# Patient Record
Sex: Male | Born: 1935 | Race: White | Hispanic: No | Smoking: Former smoker
Health system: Southern US, Community
[De-identification: ages and names within clinical notes are randomized; demographics above are authoritative.]

## PROBLEM LIST (undated history)

## (undated) DIAGNOSIS — I10 Essential (primary) hypertension: Secondary | ICD-10-CM

## (undated) DIAGNOSIS — E785 Hyperlipidemia, unspecified: Secondary | ICD-10-CM

## (undated) DIAGNOSIS — N289 Disorder of kidney and ureter, unspecified: Secondary | ICD-10-CM

## (undated) DIAGNOSIS — I2510 Atherosclerotic heart disease of native coronary artery without angina pectoris: Secondary | ICD-10-CM

## (undated) DIAGNOSIS — Z8489 Family history of other specified conditions: Secondary | ICD-10-CM

## (undated) DIAGNOSIS — I951 Orthostatic hypotension: Secondary | ICD-10-CM

## (undated) DIAGNOSIS — M7989 Other specified soft tissue disorders: Secondary | ICD-10-CM

## (undated) DIAGNOSIS — K219 Gastro-esophageal reflux disease without esophagitis: Secondary | ICD-10-CM

## (undated) DIAGNOSIS — M1990 Unspecified osteoarthritis, unspecified site: Secondary | ICD-10-CM

## (undated) HISTORY — DX: Unspecified osteoarthritis, unspecified site: M19.90

## (undated) HISTORY — DX: Atherosclerotic heart disease of native coronary artery without angina pectoris: I25.10

## (undated) HISTORY — DX: Essential (primary) hypertension: I10

## (undated) HISTORY — DX: Disorder of kidney and ureter, unspecified: N28.9

## (undated) HISTORY — DX: Hyperlipidemia, unspecified: E78.5

## (undated) HISTORY — DX: Orthostatic hypotension: I95.1

## (undated) HISTORY — DX: Other specified soft tissue disorders: M79.89

---

## 1996-08-01 HISTORY — PX: CORONARY ANGIOPLASTY: SHX604

## 1997-06-14 HISTORY — PX: CARDIAC CATHETERIZATION: SHX172

## 1998-02-19 ENCOUNTER — Other Ambulatory Visit: Admission: RE | Admit: 1998-02-19 | Discharge: 1998-02-19 | Payer: Self-pay | Admitting: Family Medicine

## 1998-09-25 ENCOUNTER — Ambulatory Visit (HOSPITAL_COMMUNITY): Admission: RE | Admit: 1998-09-25 | Discharge: 1998-09-25 | Payer: Self-pay | Admitting: Family Medicine

## 1999-01-05 ENCOUNTER — Ambulatory Visit (HOSPITAL_COMMUNITY): Admission: RE | Admit: 1999-01-05 | Discharge: 1999-01-05 | Payer: Self-pay | Admitting: Family Medicine

## 1999-06-11 ENCOUNTER — Ambulatory Visit (HOSPITAL_COMMUNITY): Admission: RE | Admit: 1999-06-11 | Discharge: 1999-06-12 | Payer: Self-pay | Admitting: Cardiology

## 1999-06-11 HISTORY — PX: CARDIAC CATHETERIZATION: SHX172

## 2000-10-27 ENCOUNTER — Encounter: Admission: RE | Admit: 2000-10-27 | Discharge: 2000-10-27 | Payer: Self-pay | Admitting: Family Medicine

## 2000-10-27 ENCOUNTER — Encounter: Payer: Self-pay | Admitting: Family Medicine

## 2003-04-09 ENCOUNTER — Encounter: Payer: Self-pay | Admitting: Cardiology

## 2003-04-09 ENCOUNTER — Encounter: Admission: RE | Admit: 2003-04-09 | Discharge: 2003-04-09 | Payer: Self-pay | Admitting: Cardiology

## 2003-04-16 ENCOUNTER — Ambulatory Visit (HOSPITAL_COMMUNITY): Admission: RE | Admit: 2003-04-16 | Discharge: 2003-04-16 | Payer: Self-pay | Admitting: Cardiology

## 2003-04-16 HISTORY — PX: CARDIAC CATHETERIZATION: SHX172

## 2004-02-25 ENCOUNTER — Encounter: Admission: RE | Admit: 2004-02-25 | Discharge: 2004-02-25 | Payer: Self-pay | Admitting: Family Medicine

## 2005-08-20 ENCOUNTER — Encounter: Admission: RE | Admit: 2005-08-20 | Discharge: 2005-08-20 | Payer: Self-pay | Admitting: Family Medicine

## 2005-09-01 ENCOUNTER — Encounter: Admission: RE | Admit: 2005-09-01 | Discharge: 2005-09-01 | Payer: Self-pay | Admitting: Family Medicine

## 2006-09-16 ENCOUNTER — Encounter: Admission: RE | Admit: 2006-09-16 | Discharge: 2006-09-16 | Payer: Self-pay | Admitting: Family Medicine

## 2006-10-04 ENCOUNTER — Encounter: Admission: RE | Admit: 2006-10-04 | Discharge: 2006-11-02 | Payer: Self-pay | Admitting: Family Medicine

## 2007-11-16 HISTORY — PX: BACK SURGERY: SHX140

## 2009-07-11 ENCOUNTER — Inpatient Hospital Stay (HOSPITAL_COMMUNITY): Admission: EM | Admit: 2009-07-11 | Discharge: 2009-07-17 | Payer: Self-pay | Admitting: Emergency Medicine

## 2009-07-15 ENCOUNTER — Ambulatory Visit: Payer: Self-pay | Admitting: Oncology

## 2009-07-17 ENCOUNTER — Ambulatory Visit: Payer: Self-pay | Admitting: Oncology

## 2009-07-28 LAB — COMPREHENSIVE METABOLIC PANEL
ALT: 36 U/L (ref 0–53)
AST: 37 U/L (ref 0–37)
Albumin: 3.6 g/dL (ref 3.5–5.2)
CO2: 26 mEq/L (ref 19–32)
Calcium: 9.2 mg/dL (ref 8.4–10.5)
Chloride: 105 mEq/L (ref 96–112)
Potassium: 3.4 mEq/L — ABNORMAL LOW (ref 3.5–5.3)
Total Protein: 6.6 g/dL (ref 6.0–8.3)

## 2009-07-28 LAB — CBC & DIFF AND RETIC
Basophils Absolute: 0.1 10*3/uL (ref 0.0–0.1)
EOS%: 9.8 % — ABNORMAL HIGH (ref 0.0–7.0)
Eosinophils Absolute: 0.7 10*3/uL — ABNORMAL HIGH (ref 0.0–0.5)
HCT: 27 % — ABNORMAL LOW (ref 38.4–49.9)
HGB: 9 g/dL — ABNORMAL LOW (ref 13.0–17.1)
Immature Retic Fract: 7.2 % (ref 0.00–13.40)
MCH: 30.8 pg (ref 27.2–33.4)
MCV: 92.5 fL (ref 79.3–98.0)
NEUT#: 4.9 10*3/uL (ref 1.5–6.5)
NEUT%: 68.4 % (ref 39.0–75.0)
RDW: 13.7 % (ref 11.0–14.6)
Retic Ct Abs: 33.87 10*3/uL (ref 24.10–77.50)
lymph#: 1 10*3/uL (ref 0.9–3.3)

## 2009-07-28 LAB — LACTATE DEHYDROGENASE: LDH: 134 U/L (ref 94–250)

## 2009-07-28 LAB — MORPHOLOGY

## 2009-07-29 ENCOUNTER — Other Ambulatory Visit: Admission: RE | Admit: 2009-07-29 | Discharge: 2009-07-29 | Payer: Self-pay | Admitting: Oncology

## 2009-07-29 ENCOUNTER — Encounter: Payer: Self-pay | Admitting: Oncology

## 2009-07-29 LAB — BASIC METABOLIC PANEL
CO2: 29 mEq/L (ref 19–32)
Calcium: 9.1 mg/dL (ref 8.4–10.5)
Chloride: 106 mEq/L (ref 96–112)
Creatinine, Ser: 3.27 mg/dL — ABNORMAL HIGH (ref 0.40–1.50)
Glucose, Bld: 130 mg/dL — ABNORMAL HIGH (ref 70–99)
Sodium: 139 mEq/L (ref 135–145)

## 2009-07-30 LAB — BETA 2 MICROGLOBULIN, SERUM: Beta-2 Microglobulin: 6.15 mg/L — ABNORMAL HIGH (ref 1.01–1.73)

## 2009-07-30 LAB — SPEP & IFE WITH QIG
Alpha-1-Globulin: 4.9 % (ref 2.9–4.9)
Alpha-2-Globulin: 12.4 % — ABNORMAL HIGH (ref 7.1–11.8)
Beta 2: 4.3 % (ref 3.2–6.5)
Gamma Globulin: 13.8 % (ref 11.1–18.8)

## 2009-07-30 LAB — KAPPA/LAMBDA LIGHT CHAINS
Kappa free light chain: 2.34 mg/dL — ABNORMAL HIGH (ref 0.33–1.94)
Kappa:Lambda Ratio: 0.95 (ref 0.26–1.65)

## 2009-08-18 ENCOUNTER — Ambulatory Visit: Payer: Self-pay | Admitting: Oncology

## 2009-08-18 LAB — CBC & DIFF AND RETIC
Basophils Absolute: 0.1 10*3/uL (ref 0.0–0.1)
EOS%: 15.4 % — ABNORMAL HIGH (ref 0.0–7.0)
Eosinophils Absolute: 0.8 10*3/uL — ABNORMAL HIGH (ref 0.0–0.5)
HGB: 10.9 g/dL — ABNORMAL LOW (ref 13.0–17.1)
Immature Retic Fract: 6.8 % (ref 0.00–13.40)
MCH: 32.2 pg (ref 27.2–33.4)
MCV: 102.7 fL — ABNORMAL HIGH (ref 79.3–98.0)
MONO%: 7 % (ref 0.0–14.0)
NEUT#: 3.3 10*3/uL (ref 1.5–6.5)
RBC: 3.38 10*6/uL — ABNORMAL LOW (ref 4.20–5.82)
RDW: 17.3 % — ABNORMAL HIGH (ref 11.0–14.6)
Retic %: 1.28 % (ref 0.50–1.60)
Retic Ct Abs: 43.26 10*3/uL (ref 24.10–77.50)
lymph#: 0.8 10*3/uL — ABNORMAL LOW (ref 0.9–3.3)

## 2009-08-18 LAB — BASIC METABOLIC PANEL
BUN: 18 mg/dL (ref 6–23)
Calcium: 9 mg/dL (ref 8.4–10.5)
Chloride: 108 mEq/L (ref 96–112)
Creatinine, Ser: 2.02 mg/dL — ABNORMAL HIGH (ref 0.40–1.50)

## 2009-09-08 LAB — CBC WITH DIFFERENTIAL/PLATELET
BASO%: 0.5 % (ref 0.0–2.0)
Basophils Absolute: 0 10*3/uL (ref 0.0–0.1)
EOS%: 17 % — ABNORMAL HIGH (ref 0.0–7.0)
HCT: 39.8 % (ref 38.4–49.9)
HGB: 13 g/dL (ref 13.0–17.1)
LYMPH%: 12.3 % — ABNORMAL LOW (ref 14.0–49.0)
MCH: 32.5 pg (ref 27.2–33.4)
MCHC: 32.6 g/dL (ref 32.0–36.0)
MCV: 99.8 fL — ABNORMAL HIGH (ref 79.3–98.0)
NEUT%: 60.2 % (ref 39.0–75.0)
Platelets: 108 10*3/uL — ABNORMAL LOW (ref 140–400)

## 2009-09-25 ENCOUNTER — Ambulatory Visit: Payer: Self-pay | Admitting: Oncology

## 2009-09-29 LAB — CBC WITH DIFFERENTIAL/PLATELET
BASO%: 0.3 % (ref 0.0–2.0)
EOS%: 17.1 % — ABNORMAL HIGH (ref 0.0–7.0)
MCH: 32.1 pg (ref 27.2–33.4)
MCHC: 33 g/dL (ref 32.0–36.0)
MCV: 97.3 fL (ref 79.3–98.0)
MONO%: 8.5 % (ref 0.0–14.0)
RDW: 14.5 % (ref 11.0–14.6)
lymph#: 1 10*3/uL (ref 0.9–3.3)

## 2009-10-13 ENCOUNTER — Emergency Department (HOSPITAL_COMMUNITY): Admission: EM | Admit: 2009-10-13 | Discharge: 2009-10-13 | Payer: Self-pay | Admitting: Emergency Medicine

## 2009-10-20 LAB — CBC WITH DIFFERENTIAL/PLATELET
BASO%: 0.8 % (ref 0.0–2.0)
Basophils Absolute: 0 10*3/uL (ref 0.0–0.1)
Eosinophils Absolute: 1.1 10*3/uL — ABNORMAL HIGH (ref 0.0–0.5)
HCT: 38.4 % (ref 38.4–49.9)
HGB: 13.1 g/dL (ref 13.0–17.1)
LYMPH%: 13.1 % — ABNORMAL LOW (ref 14.0–49.0)
MCHC: 34.2 g/dL (ref 32.0–36.0)
MONO#: 0.4 10*3/uL (ref 0.1–0.9)
NEUT%: 61.4 % (ref 39.0–75.0)
Platelets: 83 10*3/uL — ABNORMAL LOW (ref 140–400)
WBC: 6.2 10*3/uL (ref 4.0–10.3)

## 2009-11-05 ENCOUNTER — Ambulatory Visit: Payer: Self-pay | Admitting: Oncology

## 2009-11-10 LAB — CBC WITH DIFFERENTIAL/PLATELET
BASO%: 0.3 % (ref 0.0–2.0)
Basophils Absolute: 0 10*3/uL (ref 0.0–0.1)
EOS%: 15.6 % — ABNORMAL HIGH (ref 0.0–7.0)
HCT: 36 % — ABNORMAL LOW (ref 38.4–49.9)
LYMPH%: 14.7 % (ref 14.0–49.0)
MCH: 32.3 pg (ref 27.2–33.4)
MCHC: 34.4 g/dL (ref 32.0–36.0)
MCV: 93.9 fL (ref 79.3–98.0)
MONO%: 6.8 % (ref 0.0–14.0)
NEUT%: 62.6 % (ref 39.0–75.0)
Platelets: 83 10*3/uL — ABNORMAL LOW (ref 140–400)

## 2009-11-14 ENCOUNTER — Observation Stay (HOSPITAL_COMMUNITY): Admission: EM | Admit: 2009-11-14 | Discharge: 2009-11-15 | Payer: Self-pay | Admitting: Emergency Medicine

## 2009-11-18 ENCOUNTER — Ambulatory Visit: Payer: Self-pay | Admitting: Oncology

## 2009-11-18 LAB — CBC & DIFF AND RETIC
Eosinophils Absolute: 0.7 10*3/uL — ABNORMAL HIGH (ref 0.0–0.5)
HGB: 11.6 g/dL — ABNORMAL LOW (ref 13.0–17.1)
MCH: 30.9 pg (ref 27.2–33.4)
MONO#: 0.8 10*3/uL (ref 0.1–0.9)
NEUT#: 4.9 10*3/uL (ref 1.5–6.5)
RBC: 3.75 10*6/uL — ABNORMAL LOW (ref 4.20–5.82)
RDW: 14.1 % (ref 11.0–14.6)
Retic Ct Abs: 25.88 10*3/uL (ref 24.10–77.50)
WBC: 7.2 10*3/uL (ref 4.0–10.3)
lymph#: 0.8 10*3/uL — ABNORMAL LOW (ref 0.9–3.3)

## 2009-11-18 LAB — MORPHOLOGY: PLT EST: DECREASED

## 2009-11-20 LAB — BASIC METABOLIC PANEL
CO2: 24 mEq/L (ref 19–32)
Creatinine, Ser: 2.21 mg/dL — ABNORMAL HIGH (ref 0.40–1.50)
Potassium: 3.7 mEq/L (ref 3.5–5.3)

## 2009-11-20 LAB — SPEP & IFE WITH QIG
Beta 2: 5.3 % (ref 3.2–6.5)
Beta Globulin: 6.9 % (ref 4.7–7.2)
IgG (Immunoglobin G), Serum: 817 mg/dL (ref 694–1618)
Total Protein, Serum Electrophoresis: 6.3 g/dL (ref 6.0–8.3)

## 2009-11-20 LAB — IRON AND TIBC
%SAT: 15 % — ABNORMAL LOW (ref 20–55)
Iron: 37 ug/dL — ABNORMAL LOW (ref 42–165)
TIBC: 244 ug/dL (ref 215–435)

## 2009-11-20 LAB — FERRITIN: Ferritin: 619 ng/mL — ABNORMAL HIGH (ref 22–322)

## 2009-12-04 ENCOUNTER — Ambulatory Visit (HOSPITAL_COMMUNITY): Admission: RE | Admit: 2009-12-04 | Discharge: 2009-12-04 | Payer: Self-pay | Admitting: Oncology

## 2009-12-30 ENCOUNTER — Ambulatory Visit: Payer: Self-pay | Admitting: Oncology

## 2010-01-01 LAB — CBC WITH DIFFERENTIAL/PLATELET
LYMPH%: 14.1 % (ref 14.0–49.0)
MCH: 33.3 pg (ref 27.2–33.4)
MCHC: 34.2 g/dL (ref 32.0–36.0)
NEUT%: 69.4 % (ref 39.0–75.0)
Platelets: 146 10*3/uL (ref 140–400)
lymph#: 1 10*3/uL (ref 0.9–3.3)

## 2010-01-23 LAB — CBC WITH DIFFERENTIAL/PLATELET
Basophils Absolute: 0.1 10*3/uL (ref 0.0–0.1)
EOS%: 13.4 % — ABNORMAL HIGH (ref 0.0–7.0)
Eosinophils Absolute: 0.7 10*3/uL — ABNORMAL HIGH (ref 0.0–0.5)
HCT: 34.2 % — ABNORMAL LOW (ref 38.4–49.9)
MCHC: 33.5 g/dL (ref 32.0–36.0)
MCV: 98.5 fL — ABNORMAL HIGH (ref 79.3–98.0)
NEUT%: 63.9 % (ref 39.0–75.0)
RBC: 3.47 10*6/uL — ABNORMAL LOW (ref 4.20–5.82)
RDW: 15.6 % — ABNORMAL HIGH (ref 11.0–14.6)

## 2010-02-11 ENCOUNTER — Ambulatory Visit: Payer: Self-pay | Admitting: Oncology

## 2010-02-13 LAB — CBC WITH DIFFERENTIAL/PLATELET
BASO%: 0.5 % (ref 0.0–2.0)
Basophils Absolute: 0 10*3/uL (ref 0.0–0.1)
HCT: 40.9 % (ref 38.4–49.9)
HGB: 13.4 g/dL (ref 13.0–17.1)
MCHC: 32.8 g/dL (ref 32.0–36.0)
MONO%: 10.1 % (ref 0.0–14.0)
lymph#: 0.8 10*3/uL — ABNORMAL LOW (ref 0.9–3.3)

## 2010-03-06 LAB — CBC WITH DIFFERENTIAL/PLATELET
BASO%: 0.9 % (ref 0.0–2.0)
Basophils Absolute: 0.1 10*3/uL (ref 0.0–0.1)
Eosinophils Absolute: 1.5 10*3/uL — ABNORMAL HIGH (ref 0.0–0.5)
MCH: 31.7 pg (ref 27.2–33.4)
MCHC: 33.5 g/dL (ref 32.0–36.0)
MCV: 94.5 fL (ref 79.3–98.0)
MONO#: 0.6 10*3/uL (ref 0.1–0.9)
MONO%: 8.1 % (ref 0.0–14.0)
NEUT%: 58.4 % (ref 39.0–75.0)
Platelets: 106 10*3/uL — ABNORMAL LOW (ref 140–400)
RBC: 4.16 10*6/uL — ABNORMAL LOW (ref 4.20–5.82)
WBC: 7.6 10*3/uL (ref 4.0–10.3)

## 2010-03-26 ENCOUNTER — Ambulatory Visit: Payer: Self-pay | Admitting: Oncology

## 2010-03-27 LAB — CBC WITH DIFFERENTIAL/PLATELET
BASO%: 0.6 % (ref 0.0–2.0)
Eosinophils Absolute: 0.9 10*3/uL — ABNORMAL HIGH (ref 0.0–0.5)
HGB: 12 g/dL — ABNORMAL LOW (ref 13.0–17.1)
MCH: 30.5 pg (ref 27.2–33.4)
MCHC: 32.4 g/dL (ref 32.0–36.0)
MCV: 93.9 fL (ref 79.3–98.0)
NEUT%: 61.6 % (ref 39.0–75.0)
Platelets: 80 10*3/uL — ABNORMAL LOW (ref 140–400)
RBC: 3.94 10*6/uL — ABNORMAL LOW (ref 4.20–5.82)
RDW: 14.7 % — ABNORMAL HIGH (ref 11.0–14.6)
lymph#: 1.1 10*3/uL (ref 0.9–3.3)

## 2010-04-29 ENCOUNTER — Ambulatory Visit: Payer: Self-pay | Admitting: Oncology

## 2010-12-26 IMAGING — CR DG BONE SURVEY MET
9 of 10 series · 9 of 10 positions shown · non-contrast
Comparison: None

CLINICAL DATA: Diabetes, renal failure.

METASTATIC BONE SURVEY

[w c-spine lat]
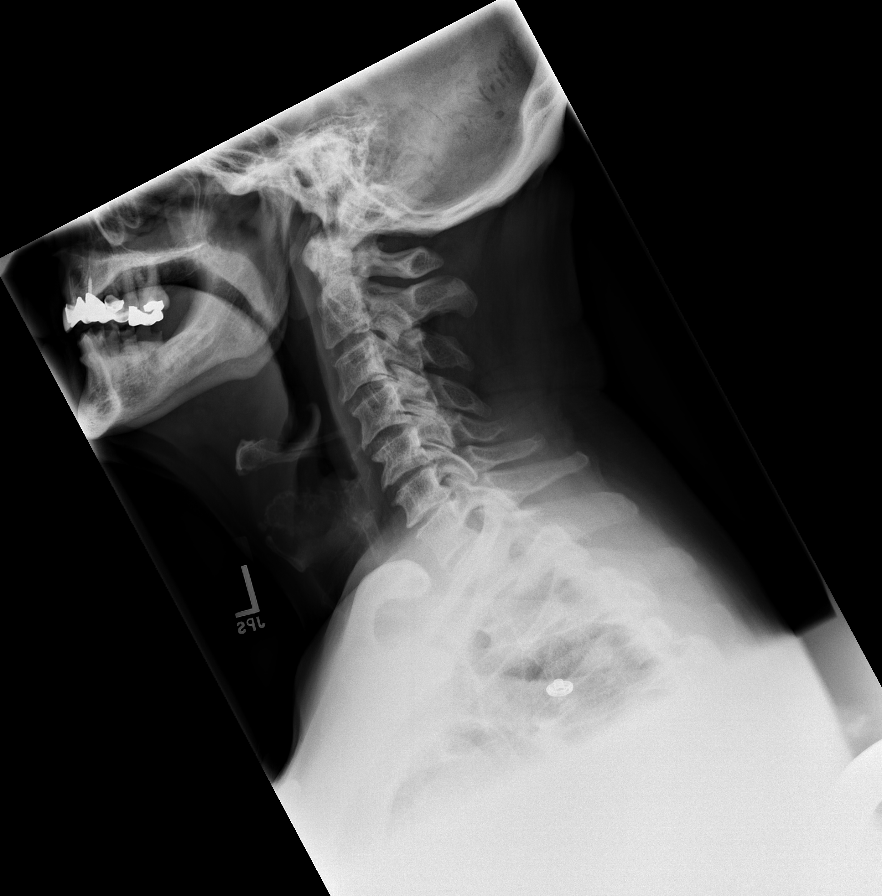

[w c-spine a.p.]
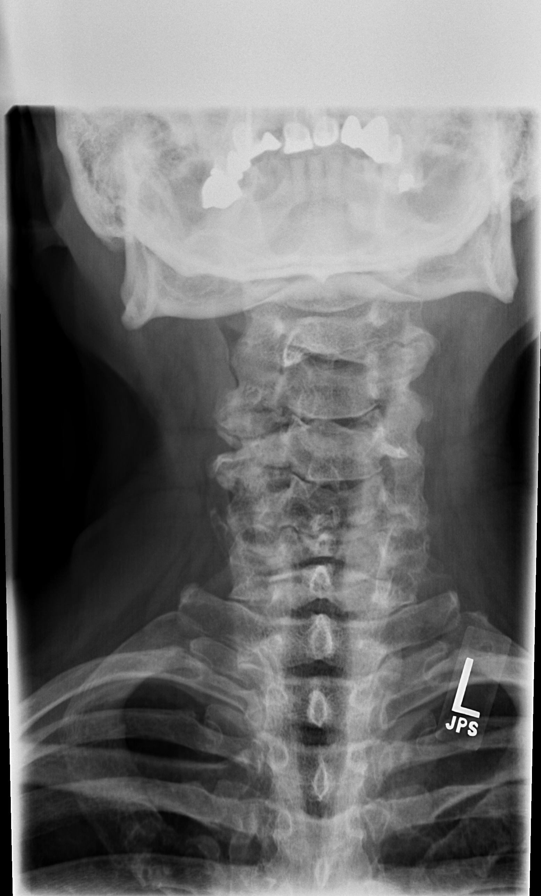

[t t-spine a.p.]
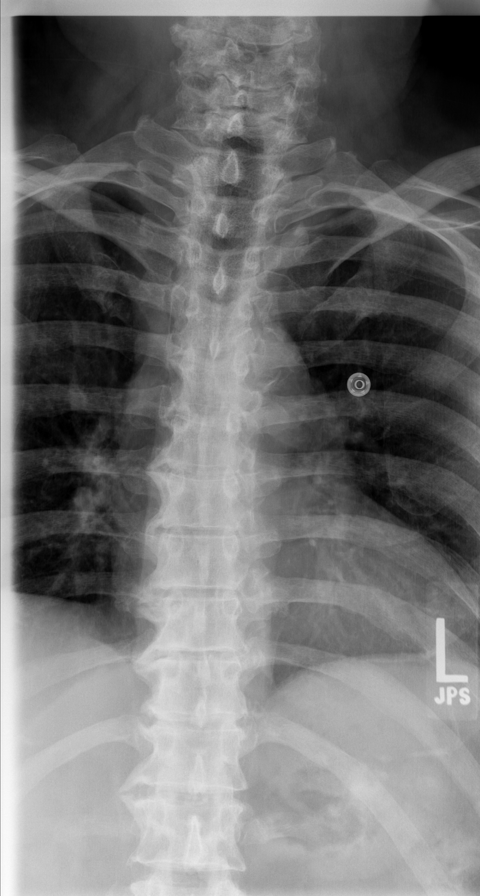

[t l-spine a.p.]
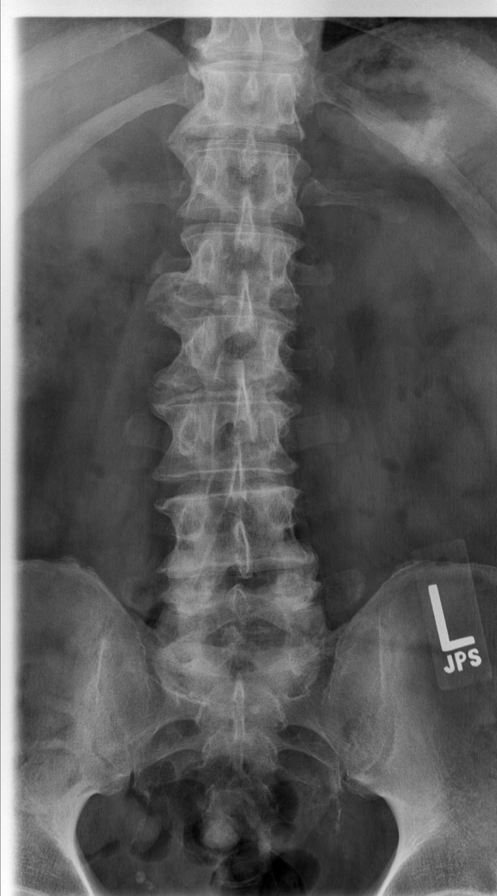

[t pelvis a.p.]
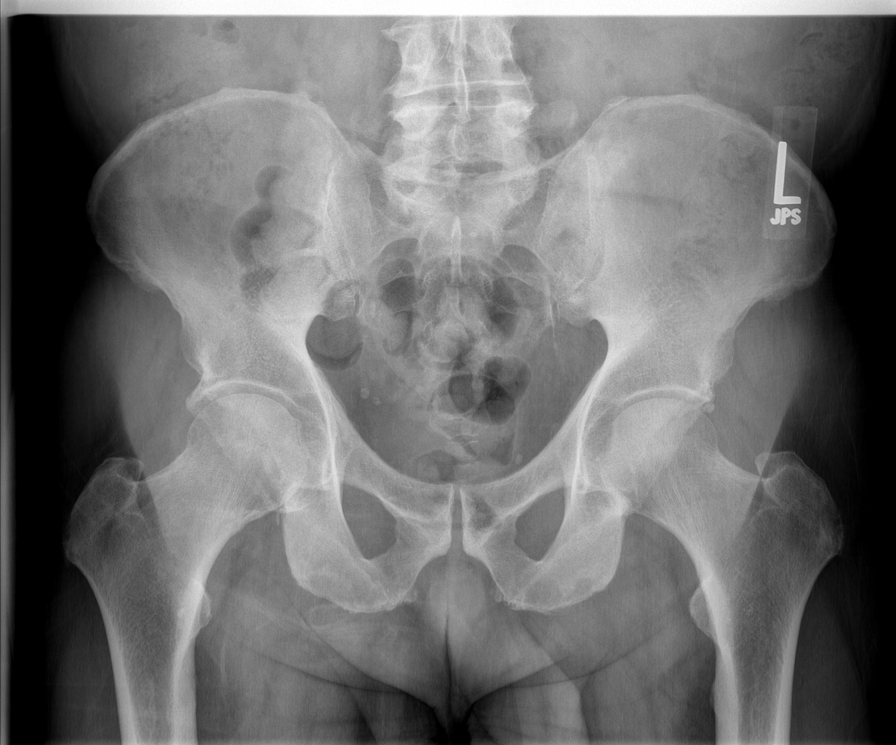

[t femur with hip  ap left]
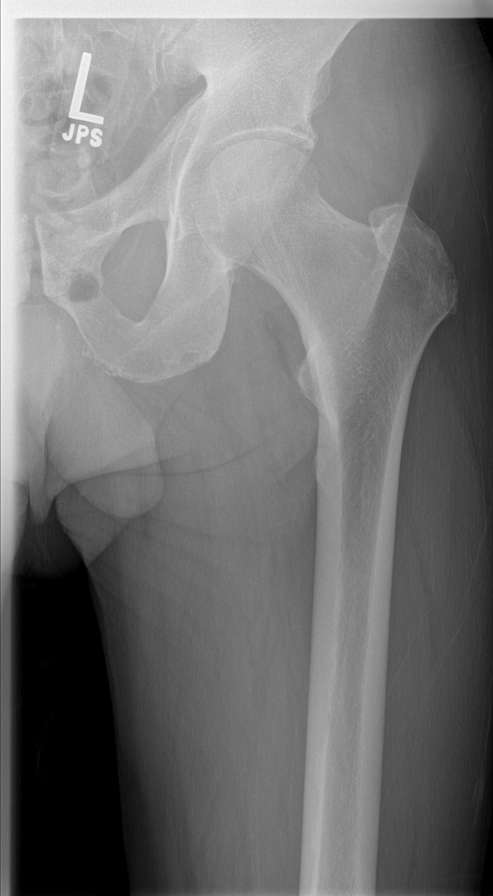

[t femur with hip  ap right]
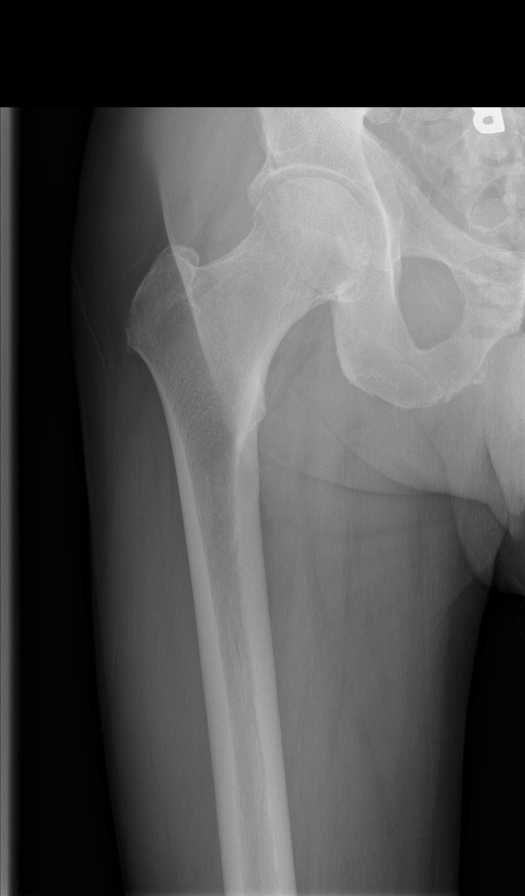

[t shoulder ap external left]
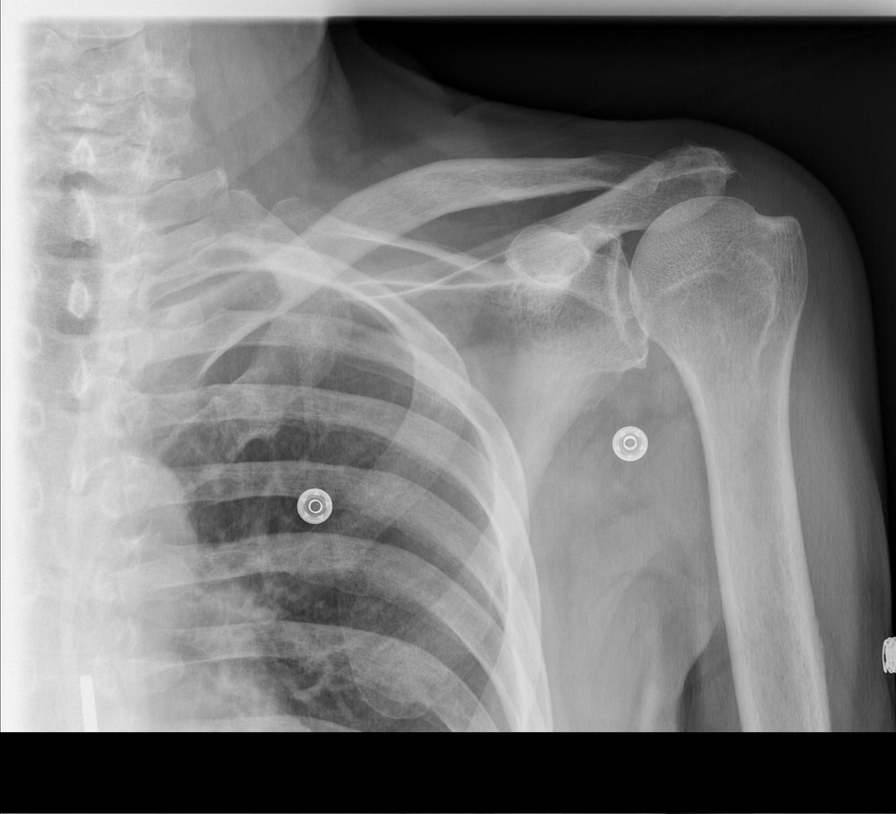

[t shoulder ap external righ]
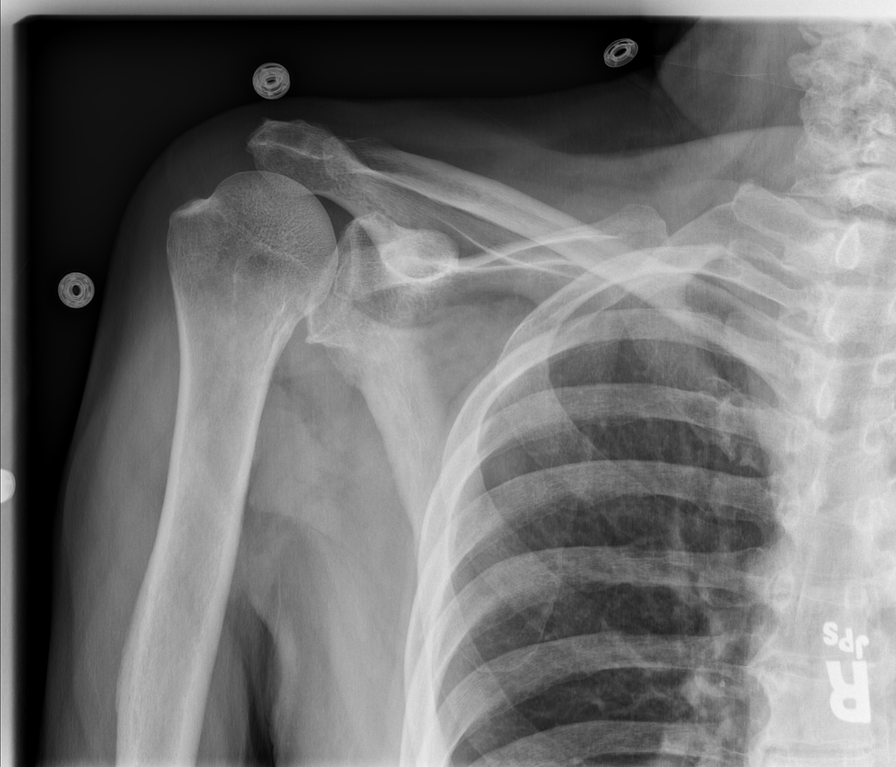

[9 of 10 positions shown; findings below may reference images not displayed]

FINDINGS: There are degenerative changes throughout the spine.  No
focal bony abnormality or abnormality in bone mineralization noted.
Calcifications noted in the aorta.

Bony pelvis unremarkable.  No acute bony abnormality.  Early
degenerative changes in the hips.

Degenerative changes in the AC joints bilaterally.  Humeri
unremarkable.

Proximal femurs unremarkable.

There is a tiny well-circumscribed lucent area noted in the
posterior skull.  Given that there are no other lucent lesions
throughout the visualized skeleton, this may simply represent a
small venous lake.
IMPRESSION: Small well circumscribed lucent area in the posterior skull as
described above.  Question venous lake.

Degenerative changes throughout the spine.

No acute bony abnormality.

## 2011-01-31 LAB — CARDIAC PANEL(CRET KIN+CKTOT+MB+TROPI)
Total CK: 46 U/L (ref 7–232)
Troponin I: 0.05 ng/mL (ref 0.00–0.06)

## 2011-01-31 LAB — T4, FREE: Free T4: 0.9 ng/dL (ref 0.80–1.80)

## 2011-01-31 LAB — GLUCOSE, CAPILLARY

## 2011-02-15 LAB — GLUCOSE, CAPILLARY: Glucose-Capillary: 107 mg/dL — ABNORMAL HIGH (ref 70–99)

## 2011-02-15 LAB — URINALYSIS, ROUTINE W REFLEX MICROSCOPIC
Bilirubin Urine: NEGATIVE
Glucose, UA: NEGATIVE mg/dL
Hgb urine dipstick: NEGATIVE
Ketones, ur: NEGATIVE mg/dL
Nitrite: NEGATIVE
Specific Gravity, Urine: 1.017 (ref 1.005–1.030)
pH: 7.5 (ref 5.0–8.0)

## 2011-02-15 LAB — BASIC METABOLIC PANEL
CO2: 24 mEq/L (ref 19–32)
Calcium: 9.2 mg/dL (ref 8.4–10.5)
Glucose, Bld: 131 mg/dL — ABNORMAL HIGH (ref 70–99)
Potassium: 4.2 mEq/L (ref 3.5–5.1)
Sodium: 138 mEq/L (ref 135–145)

## 2011-02-15 LAB — HEMOGLOBIN A1C
Hgb A1c MFr Bld: 7 % — ABNORMAL HIGH (ref 4.6–6.1)
Mean Plasma Glucose: 154 mg/dL

## 2011-02-15 LAB — URINE CULTURE
Colony Count: NO GROWTH
Culture: NO GROWTH

## 2011-02-15 LAB — CBC
RBC: 3.67 MIL/uL — ABNORMAL LOW (ref 4.22–5.81)
WBC: 9.8 10*3/uL (ref 4.0–10.5)

## 2011-02-15 LAB — CARDIAC PANEL(CRET KIN+CKTOT+MB+TROPI)
CK, MB: 2.1 ng/mL (ref 0.3–4.0)
Troponin I: 0.04 ng/mL (ref 0.00–0.06)

## 2011-02-15 LAB — D-DIMER, QUANTITATIVE: D-Dimer, Quant: 0.39 ug/mL-FEU (ref 0.00–0.48)

## 2011-02-15 LAB — DIFFERENTIAL
Basophils Absolute: 0 10*3/uL (ref 0.0–0.1)
Basophils Relative: 0 % (ref 0–1)
Eosinophils Absolute: 0.4 10*3/uL (ref 0.0–0.7)
Monocytes Absolute: 0.7 10*3/uL (ref 0.1–1.0)

## 2011-02-15 LAB — POCT CARDIAC MARKERS
CKMB, poc: 1.2 ng/mL (ref 1.0–8.0)
Myoglobin, poc: 88.1 ng/mL (ref 12–200)

## 2011-02-15 LAB — CK TOTAL AND CKMB (NOT AT ARMC)
CK, MB: 2.5 ng/mL (ref 0.3–4.0)
Relative Index: INVALID (ref 0.0–2.5)

## 2011-02-15 LAB — BRAIN NATRIURETIC PEPTIDE: Pro B Natriuretic peptide (BNP): 107 pg/mL — ABNORMAL HIGH (ref 0.0–100.0)

## 2011-02-17 LAB — CBC
Hemoglobin: 12.8 g/dL — ABNORMAL LOW (ref 13.0–17.0)
MCHC: 34.3 g/dL (ref 30.0–36.0)
RBC: 3.88 MIL/uL — ABNORMAL LOW (ref 4.22–5.81)

## 2011-02-17 LAB — COMPREHENSIVE METABOLIC PANEL
ALT: 21 U/L (ref 0–53)
AST: 25 U/L (ref 0–37)
Alkaline Phosphatase: 53 U/L (ref 39–117)
CO2: 27 mEq/L (ref 19–32)
Calcium: 9.1 mg/dL (ref 8.4–10.5)
GFR calc Af Amer: 40 mL/min — ABNORMAL LOW (ref >60)
GFR calc non Af Amer: 33 mL/min — ABNORMAL LOW (ref >60)
Potassium: 4.5 mEq/L (ref 3.5–5.1)
Sodium: 139 mEq/L (ref 135–145)
Total Protein: 6 g/dL (ref 6.0–8.3)

## 2011-02-17 LAB — DIFFERENTIAL
Basophils Relative: 1 % (ref 0–1)
Eosinophils Absolute: 0.9 10*3/uL — ABNORMAL HIGH (ref 0.0–0.7)
Eosinophils Relative: 15 % — ABNORMAL HIGH (ref 0–5)
Lymphs Abs: 0.8 10*3/uL (ref 0.7–4.0)
Monocytes Relative: 8 % (ref 3–12)

## 2011-02-17 LAB — TSH: TSH: 4.165 u[IU]/mL (ref 0.350–4.500)

## 2011-02-17 LAB — POCT CARDIAC MARKERS
CKMB, poc: 1.5 ng/mL (ref 1.0–8.0)
Troponin i, poc: <0.05 ng/mL (ref 0.00–0.09)

## 2011-02-19 LAB — CBC
HCT: 25.9 % — ABNORMAL LOW (ref 39.0–52.0)
HCT: 27 % — ABNORMAL LOW (ref 39.0–52.0)
Hemoglobin: 9.3 g/dL — ABNORMAL LOW (ref 13.0–17.0)
MCHC: 34.4 g/dL (ref 30.0–36.0)
MCHC: 34.8 g/dL (ref 30.0–36.0)
MCV: 94.4 fL (ref 78.0–100.0)
MCV: 94.9 fL (ref 78.0–100.0)
Platelets: 172 10*3/uL (ref 150–400)
Platelets: 173 10*3/uL (ref 150–400)
RDW: 13.5 % (ref 11.5–15.5)
RDW: 13.7 % (ref 11.5–15.5)
WBC: 7.3 10*3/uL (ref 4.0–10.5)

## 2011-02-19 LAB — RENAL FUNCTION PANEL
BUN: 41 mg/dL — ABNORMAL HIGH (ref 6–23)
Calcium: 8.9 mg/dL (ref 8.4–10.5)
Creatinine, Ser: 2.87 mg/dL — ABNORMAL HIGH (ref 0.4–1.5)
Glucose, Bld: 109 mg/dL — ABNORMAL HIGH (ref 70–99)
Phosphorus: 3 mg/dL (ref 2.3–4.6)
Potassium: 3.5 mEq/L (ref 3.5–5.1)

## 2011-02-19 LAB — BASIC METABOLIC PANEL
BUN: 41 mg/dL — ABNORMAL HIGH (ref 6–23)
CO2: 16 mEq/L — ABNORMAL LOW (ref 19–32)
Chloride: 117 mEq/L — ABNORMAL HIGH (ref 96–112)
Glucose, Bld: 117 mg/dL — ABNORMAL HIGH (ref 70–99)
Potassium: 4 mEq/L (ref 3.5–5.1)

## 2011-02-19 LAB — BONE MARROW EXAM: Bone Marrow Exam: 320

## 2011-02-19 LAB — GLUCOSE, CAPILLARY: Glucose-Capillary: 89 mg/dL (ref 70–99)

## 2011-02-19 LAB — CREATININE, URINE, RANDOM: Creatinine, Urine: 84.5 mg/dL

## 2011-02-19 LAB — CHROMOSOME ANALYSIS, BONE MARROW

## 2011-02-20 LAB — PROTEIN ELECTROPH W RFLX QUANT IMMUNOGLOBULINS
Albumin ELP: 53.1 % — ABNORMAL LOW (ref 55.8–66.1)
Alpha-1-Globulin: 6.2 % — ABNORMAL HIGH (ref 2.9–4.9)
Alpha-2-Globulin: 11.1 % (ref 7.1–11.8)
Alpha-2-Globulin: 12.1 % — ABNORMAL HIGH (ref 7.1–11.8)
Beta 2: 4.6 % (ref 3.2–6.5)
Beta Globulin: 6.5 % (ref 4.7–7.2)
Gamma Globulin: 17.1 % (ref 11.1–18.8)
M-Spike, %: 0.52 g/dL
Total Protein ELP: 5.9 g/dL — ABNORMAL LOW (ref 6.0–8.3)

## 2011-02-20 LAB — BASIC METABOLIC PANEL
BUN: 52 mg/dL — ABNORMAL HIGH (ref 6–23)
BUN: 63 mg/dL — ABNORMAL HIGH (ref 6–23)
Calcium: 8.3 mg/dL — ABNORMAL LOW (ref 8.4–10.5)
Calcium: 8.6 mg/dL (ref 8.4–10.5)
Chloride: 116 mEq/L — ABNORMAL HIGH (ref 96–112)
Creatinine, Ser: 2.9 mg/dL — ABNORMAL HIGH (ref 0.4–1.5)
GFR calc Af Amer: 26 mL/min — ABNORMAL LOW (ref >60)
GFR calc non Af Amer: 16 mL/min — ABNORMAL LOW (ref >60)
GFR calc non Af Amer: 18 mL/min — ABNORMAL LOW (ref >60)
Glucose, Bld: 123 mg/dL — ABNORMAL HIGH (ref 70–99)
Glucose, Bld: 134 mg/dL — ABNORMAL HIGH (ref 70–99)
Potassium: 3.7 mEq/L (ref 3.5–5.1)
Sodium: 135 mEq/L (ref 135–145)

## 2011-02-20 LAB — HEMOCCULT GUIAC POC 1CARD (OFFICE): Fecal Occult Bld: NEGATIVE

## 2011-02-20 LAB — GLUCOSE, CAPILLARY
Glucose-Capillary: 101 mg/dL — ABNORMAL HIGH (ref 70–99)
Glucose-Capillary: 114 mg/dL — ABNORMAL HIGH (ref 70–99)
Glucose-Capillary: 132 mg/dL — ABNORMAL HIGH (ref 70–99)
Glucose-Capillary: 135 mg/dL — ABNORMAL HIGH (ref 70–99)
Glucose-Capillary: 136 mg/dL — ABNORMAL HIGH (ref 70–99)
Glucose-Capillary: 164 mg/dL — ABNORMAL HIGH (ref 70–99)
Glucose-Capillary: 188 mg/dL — ABNORMAL HIGH (ref 70–99)
Glucose-Capillary: 188 mg/dL — ABNORMAL HIGH (ref 70–99)

## 2011-02-20 LAB — CARDIAC PANEL(CRET KIN+CKTOT+MB+TROPI)
Total CK: 138 U/L (ref 7–232)
Troponin I: 0.05 ng/mL (ref 0.00–0.06)

## 2011-02-20 LAB — CREATININE, URINE, 24 HOUR: Creatinine, Urine: 51.4 mg/dL

## 2011-02-20 LAB — UIFE/LIGHT CHAINS/TP QN, 24-HR UR
Albumin, U: DETECTED
Alpha 1, Urine: NOT DETECTED
Beta, Urine: DETECTED — AB
Free Lambda Excretion/Day: 59.64 mg/d
Free Lambda Lt Chains,Ur: 2.13 mg/dL — ABNORMAL HIGH (ref 0.08–1.01)
Total Protein, Urine-Ur/day: 392 mg/d — ABNORMAL HIGH (ref 10–140)
Total Protein, Urine: 14 mg/dL

## 2011-02-20 LAB — COMPREHENSIVE METABOLIC PANEL
ALT: 21 U/L (ref 0–53)
AST: 29 U/L (ref 0–37)
AST: 33 U/L (ref 0–37)
Albumin: 3 g/dL — ABNORMAL LOW (ref 3.5–5.2)
BUN: 81 mg/dL — ABNORMAL HIGH (ref 6–23)
BUN: 84 mg/dL — ABNORMAL HIGH (ref 6–23)
CO2: 17 mEq/L — ABNORMAL LOW (ref 19–32)
CO2: 18 mEq/L — ABNORMAL LOW (ref 19–32)
Calcium: 8.6 mg/dL (ref 8.4–10.5)
Chloride: 108 mEq/L (ref 96–112)
Chloride: 109 mEq/L (ref 96–112)
Creatinine, Ser: 4.13 mg/dL — ABNORMAL HIGH (ref 0.4–1.5)
Creatinine, Ser: 4.37 mg/dL — ABNORMAL HIGH (ref 0.4–1.5)
GFR calc non Af Amer: 13 mL/min — ABNORMAL LOW (ref >60)
GFR calc non Af Amer: 14 mL/min — ABNORMAL LOW (ref >60)
GFR calc non Af Amer: 15 mL/min — ABNORMAL LOW (ref >60)
Glucose, Bld: 117 mg/dL — ABNORMAL HIGH (ref 70–99)
Glucose, Bld: 135 mg/dL — ABNORMAL HIGH (ref 70–99)
Glucose, Bld: 190 mg/dL — ABNORMAL HIGH (ref 70–99)
Potassium: 3 mEq/L — ABNORMAL LOW (ref 3.5–5.1)
Total Bilirubin: 2.4 mg/dL — ABNORMAL HIGH (ref 0.3–1.2)

## 2011-02-20 LAB — PSA
PSA: 0.77 ng/mL (ref 0.10–4.00)
PSA: 0.97 ng/mL (ref 0.10–4.00)

## 2011-02-20 LAB — CBC
HCT: 27.4 % — ABNORMAL LOW (ref 39.0–52.0)
Hemoglobin: 9.6 g/dL — ABNORMAL LOW (ref 13.0–17.0)
MCHC: 34.1 g/dL (ref 30.0–36.0)
MCV: 95 fL (ref 78.0–100.0)
Platelets: 167 10*3/uL (ref 150–400)
Platelets: 180 10*3/uL (ref 150–400)
Platelets: 242 10*3/uL (ref 150–400)
RBC: 2.89 MIL/uL — ABNORMAL LOW (ref 4.22–5.81)
RDW: 13.5 % (ref 11.5–15.5)
RDW: 13.5 % (ref 11.5–15.5)
WBC: 10.2 10*3/uL (ref 4.0–10.5)
WBC: 7.7 10*3/uL (ref 4.0–10.5)
WBC: 8.2 10*3/uL (ref 4.0–10.5)

## 2011-02-20 LAB — DIFFERENTIAL
Eosinophils Absolute: 0.6 10*3/uL (ref 0.0–0.7)
Eosinophils Relative: 7 % — ABNORMAL HIGH (ref 0–5)
Lymphocytes Relative: 10 % — ABNORMAL LOW (ref 12–46)
Lymphs Abs: 0.9 10*3/uL (ref 0.7–4.0)
Monocytes Relative: 11 % (ref 3–12)

## 2011-02-20 LAB — URINALYSIS, ROUTINE W REFLEX MICROSCOPIC
Bilirubin Urine: NEGATIVE
Leukocytes, UA: NEGATIVE
Nitrite: NEGATIVE
Specific Gravity, Urine: 1.017 (ref 1.005–1.030)
Urobilinogen, UA: 1 mg/dL (ref 0.0–1.0)

## 2011-02-20 LAB — VITAMIN B12: Vitamin B-12: 381 pg/mL (ref 211–911)

## 2011-02-20 LAB — KAPPA/LAMBDA LIGHT CHAINS
Kappa, lambda light chain ratio: 0.85 (ref 0.26–1.65)
Lambda free light chains: 1.77 mg/dL (ref 0.57–2.63)

## 2011-02-20 LAB — IRON AND TIBC
Iron: 63 ug/dL (ref 42–135)
Saturation Ratios: 24 % (ref 20–55)
TIBC: 261 ug/dL (ref 215–435)

## 2011-02-20 LAB — IGG, IGA, IGM
IgA: 154 mg/dL (ref 68–378)
IgG (Immunoglobin G), Serum: 1100 mg/dL (ref 694–1618)
IgM, Serum: 51 mg/dL — ABNORMAL LOW (ref 60–263)

## 2011-02-20 LAB — PROTEIN ELECTROPHORESIS, SERUM
Alpha-1-Globulin: 5.7 % — ABNORMAL HIGH (ref 2.9–4.9)
Beta 2: 5.4 % (ref 3.2–6.5)
Gamma Globulin: 17 % (ref 11.1–18.8)

## 2011-02-20 LAB — POCT I-STAT, CHEM 8
HCT: 35 % — ABNORMAL LOW (ref 39.0–52.0)
Hemoglobin: 11.9 g/dL — ABNORMAL LOW (ref 13.0–17.0)
Sodium: 138 mEq/L (ref 135–145)
TCO2: 17 mmol/L (ref 0–100)

## 2011-02-20 LAB — URINE CULTURE
Colony Count: NO GROWTH
Culture: NO GROWTH

## 2011-02-20 LAB — RETICULOCYTES
RBC.: 3.07 MIL/uL — ABNORMAL LOW (ref 4.22–5.81)
Retic Ct Pct: 0.8 % (ref 0.4–3.1)

## 2011-02-20 LAB — URINE MICROSCOPIC-ADD ON

## 2011-02-20 LAB — ANTI-NUCLEAR AB-TITER (ANA TITER): ANA Titer 1: NEGATIVE

## 2011-02-20 LAB — PTH, INTACT AND CALCIUM
Calcium, Total (PTH): 8.5 mg/dL (ref 8.4–10.5)
PTH: 92.8 pg/mL — ABNORMAL HIGH (ref 14.0–72.0)

## 2011-02-20 LAB — HEMOGLOBIN A1C: Mean Plasma Glucose: 137 mg/dL

## 2011-02-20 LAB — ANA
Anti Nuclear Antibody(ANA): POSITIVE — AB
Anti Nuclear Antibody(ANA): POSITIVE — AB

## 2011-02-20 LAB — PHOSPHORUS: Phosphorus: 2.3 mg/dL (ref 2.3–4.6)

## 2011-02-20 LAB — MAGNESIUM: Magnesium: 2.2 mg/dL (ref 1.5–2.5)

## 2011-02-20 LAB — IMMUNOFIXATION ADD-ON

## 2011-02-20 LAB — PROTEIN, URINE, RANDOM: Total Protein, Urine: 15 mg/dL

## 2011-03-30 NOTE — Group Therapy Note (Signed)
NAME:  Bryan Pham, Bryan Pham NO.:  [id number]   MEDICAL RECORD NO.:  [id number]          PATIENT TYPE:  INP   LOCATION:  6706                         FACILITY:  [facility]   PHYSICIAN:  Bryan Pham, MDDATE OF BIRTH:  1929-09-16                                 PROGRESS NOTE   PRIMARY CARE PHYSICIAN:  Bryan Gins, MD.   NEPHROLOGY:  Bryan Latino, MD.   HEMATOLOGY/ONCOLOGY:  Bryan Crane, MD.   REASON FOR ADMISSION:  Generalized weakness and abnormal labs.   CURRENT DIAGNOSES:  1. Acute kidney injury upon possible existing chronic kidney disease.  2. Hypertension.  3. History of coronary artery disease.  4. Dyslipidemia.  5. Diabetes mellitus.  6. Rule out multiple myeloma versus MGUS - Hem/Onc on board.  7. Serum H.  pylori positive with acid dyspepsia - to get H. pylori      treatment at the time of discharge.   CURRENT MEDICATIONS:  1. Atenolol 25 mg p.o. daily.  2. NovoLog sliding scale at sensitive scale q.a.c., nightly.  3. Prilosec 20 mg p.o. daily.  4. MiraLax 17 grams p.o. p.r.n.  5. Zocor 20 mg p.o. nightly.  6. Heparin for DVT prophylaxis.   HOSPITAL COURSE SO FAR:  [name] is a 77 year old gentleman who was admitted on August  27 as he was feeling weak and having lack of appetite and he could not  even move a few steps inside the house.  The patient said that he  initially started feeling sick 3 weeks ago and it has been gradually  worsening and he has not eaten anything for the past few days except  sips of water.  There was no diarrhea and no nausea or vomiting.  He  felt very weak and he could not even get up from his bed to go to the  sofa which was placed nearby.  There were no falls, no loss of  consciousness at any time.   The patient was seen in the emergency room and was found to have acute  renal failure.  Because of the symptoms of uremia prominently present,  the possibility of acute kidney injury on top of existing  chronic renal  failure was thought.  He had epithelial casts in the urine which pointed  towards possibility of acute tubular necrosis.  He was started on IV  fluids and after that his creatinine improved and today it has come down  to 2.9 from the beginning level of 4.5.   The patient had a urine protein electrophoresis immunofixation ordered  which showed the presence of kappa and lambda chains in the urine and  positive for monoclonal free lambda chains in the urine.  In view of  that, the possibility of multiple myeloma versus MGUS was thought about.  The patient's calcium was normal at the time of presentation and in view  of this, xrays to rule out lytic lesions in the bones as well done today  by Hematology.  Hem/Onc consult has been called and Dr. Donnie Pham is seeing  the patient for possibility of MGUS versus multiple myeloma.  The patient was seen and examined today.  No complaints.  The patient is  apparently eating very well.  His appetite is good and no other issues.  VITAL SIGNS:  Temperature max is 98.6, heart rate 79, respiration 18.  Blood pressure 125/69, O2 saturation 98% on room air.  Input output:  On August 30 the intake is 3115 and the output is 2300.   CLINICAL EXAMINATION:  The patient is awake, alert, not in any distress.  HEAD/NECK EXAM:  Pupils reactive to light.  No JVD.  No bruit.  CHEST:  Bilaterally there are minimal crackles heard at the bases.  He  is off IV fluids at this time and no wheeze.  No harsh breath sounds.  CARDIAC:  S1, S2 heard, regular.  No murmurs.  ABDOMEN:  Soft, nontender, no organomegaly.  Bowel sounds positive.  No  costovertebral angle tenderness.  EXTREMITIES:  No pedal edema.   LABORATORIES:  Today show BMET as sodium 138, potassium 3.7, chloride 116, bicarb 16,  glucose 118, BUN 44, creatinine 2.9 and calcium 8.5.  Serum protein  electrophoresis shows a total protein 6.9, albumin 554.1, alpha-1 is  5.7, alpha-2 is 7.0, beta is  6.8 and beta-2 is 5.4 and gammaglobulin  is17.0.  M spike is positive at 0.67.  Urine protein electrophoresis and immunofixation:  Immunofixation  positive for monoclonal free lambda light chain 14.  Total free kappa  chain over one day is 305.2 mg/dL, pre-kappa light chains is 10.9 mg/dL.  Free lambda light chains are 2.13 mg/dL.  Total free lambda chain  excretion per day is 59.64 mg/dL.  ANA is positive.  Antinuclear antibody titer is negative.  The immunofixation monoclonal IgG lambda protein and IgG kappa protein  are present.  Total immunoglobulin levels:  IgA 163, IgG 1270, IgM 51.  Smear review:  Unremarkable morphology.  Total parathyroid hormone:  Parathyroid hormone 92.8 with a concomitant  total calcium of 8.5.  H. pylori antibody:  IgG is 5.2 which is elevated, positive.  The last CBC was WBC 7.6, hemoglobin 9.6, hematocrit 28 and platelet  count 167.  Anemia panel:  Reticulocyte count 0.8, iron 63, total iron binding  capacity 61, percent saturation of 24% and B12 was 381 and folate is  2.9.   ASSESSMENT/PLAN:  1. Acute kidney injury on top of existing chronic kidney disease.  We      will discontinue the IV fluids at this time and can watch the renal      function improving.  It has improved considerably at this time.  No      further need for IV fluids.  Nephrology has stated the patient can      be discharged if stable otherwise.  2. Rule out monoclonal gammopathy of undetermined significance versus      multiple myeloma.  Hematology/Oncology consult has been called.      Dr. Donnie Pham to see the patient today.  He does not have hypercalcemia      at any point of time so we will follow Hematology/Oncology advice      for this condition at this time.  3. Hypertension.  Continue atenolol.  Blood pressure is stable.  4. Acid dyspepsia with H. pylori antibody positive - currently the      patient will be continued on Prilosec 20 mg orally daily.  At the      time of  discharge can discharge the patient on PREVPAK (amoxycillin      +  clarithromycin + prevacid) for 14 days and then to continue      Prilosec/prevacid daily.  5. Dyslipidemia.  Continue Zocor.  6. With regards to physical therapy and occupational therapy, they      have recommended no further needs at home and the patient has      stated to the case manager that he does not need any help at home.  7. Deep vein thrombosis prophylaxis.  Heparin.   DISPOSITION:  The patient will be seen by Hematology/Oncology and then once  Hematology/Oncology clears, the patient can be discharged home.  A total  of 45 minutes spent on today's evaluation.      Bryan Massed, MD  Electronically Signed     UT/MEDQ  D:  07/15/2009  T:  07/15/2009  Job:  [id number]   cc:   Bryan Gins, MD   Bryan Crane, MD  Fax: [phone]   Bryan Latino, MD

## 2011-03-30 NOTE — Discharge Summary (Signed)
NAME:  GREGORIO, WORLEY NO.:  [id number]   MEDICAL RECORD NO.:  [id number]          PATIENT TYPE:  INP   LOCATION:  6706                         FACILITY:  [facility]   PHYSICIAN:  Peggye Pitt, M.D. DATE OF BIRTH:  September 19, 1929   DATE OF ADMISSION:  07/11/2009  DATE OF DISCHARGE:  07/17/2009                               DISCHARGE SUMMARY   DISCHARGE DIAGNOSES:  1. Acute on chronic kidney disease with the baseline creatinine around      2.4.  2. Chronic metabolic acidosis.  3. Multiple myeloma versus monoclonal gammopathy of undetermined      significance.  4. Anemia of chronic disease.  5. Hypertension.  6. History of coronary artery disease.  7. Hyperlipidemia.  8. Diabetes mellitus.   DISCHARGE MEDICATIONS:  1. Atenolol 25 mg daily.  2. Hectorol 1 mcg daily.  3. Zocor 20 mg at bedtime.  4. Sodium bicarbonate tablets 650 mg 2 tablets twice daily.  5. Omeprazole 20 mg daily.  6. Aspirin 81 mg daily.  7. Zofran 4 mg every 6 hours as needed for nausea.  8. Januvia 100 mg daily.  9. He has been instructed to stop taking his lisinopril and      fenofibrate.   CONSULTATIONS AT THIS HOSPITALIZATION:  Pierce Crane, MD with  Hematology/Oncology and Dr. Darrick Penna with Nephrology.   DISPOSITION AND FOLLOWUP:  Bryan Pham will be sent home today.  My case  manager will contact both Dr. Darrick Penna and Dr. Renelda Loma offices for  appropriate hospital followup appointments for him.   IMAGES AND PROCEDURES PERFORMED DURING THIS HOSPITALIZATION:  A renal  ultrasound on August 28 that was normal.  A skeletal survey performed on  August 31 that showed a small well-circumscribed loosened area in the  posterior skull.  Question venous lakes.  Degenerative changes  throughout the spine, with no acute bony abnormalities.   HISTORY AND PHYSICAL EXAM:  For full details, please refer to dictation  on August 27, by Dr. Ninfa Linden.  In brief, Bryan Pham is a pleasant 77-year-  old  gentleman who initially presented to the hospital at the urging of  his primary care physician after abnormal lab values came back with a  creatinine of 4.5 and a BUN of 88.  He notes that for the past 3 weeks  he has had generalized weakness, dry heaves, loss of appetite, and some  nausea and vomiting as well.  For this reason, we are asked to admit him  for further evaluation and management.   HOSPITAL COURSE BY ACTIVE PROBLEM:  For the explicit details of hospital  course prior to August 31, please refer to progress note by Dr. Berkley Harvey.  1. Acute on chronic kidney disease.  He has a baseline creatinine      anywhere between 1.8 to 2.4.  Nephrology was consulted.  His ACE      inhibitor was held.  He was given IV fluids.  Ultrasound did not      show any evidence for hydronephrosis or obstruction.  Nephrology      has now signed off.  They  would like to start seeing him in the      outpatient setting.  This will be arranged prior to his discharge.  2. For his multiple myeloma versus MGUS, a consultation with Dr. Pierce Crane with Hematology/Oncology has been obtained.  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  He will most likely need an outpatient bone      marrow biopsy and he will follow up with Dr. Donnie Coffin for this issue.      As above, appointment will be arranged prior to his discharge.  A      metabolic survey was performed that only showed a small lucency in      the skull, but did not appear to be a lytic lesion, and an SPEP      with immunofixation has showed both monoclonal IgG lambda and kappa      proteins.  3. Chronic metabolic acidosis, question secondary to his renal      insufficiency.  Nephrology has started him on sodium bicarb tabs.      His CO2 level throughout this hospitalization has maintained      between 17 and 18.  4. Anemia of chronic disease, likely secondary to his chronic kidney      disease.  His hemoglobin has remained stable in the 9.0 to 9.5       range.   All the rest of his chronic medical problems have not been an issue this  hospitalization.   VITAL SIGNS ON DAY OF DISCHARGE:  Blood pressure 133/90, heart rate 63,  respirations 20, sats 100% on room air with a temperature of 97.9.   LABORATORY DATA:  Sodium 138, potassium 3.5, chloride 113, bicarb 17,  BUN 41, creatinine 2.87, glucose of 109.  WBC 7.0, hemoglobin 9.3, and a  platelet count of 172.      Peggye Pitt, M.D.  Electronically Signed     EH/MEDQ  D:  07/17/2009  T:  07/18/2009  Job:  [id number]   cc:   Ace Gins, MD  Pierce Crane, MD  Dr. Darrick Penna

## 2011-03-30 NOTE — Consult Note (Signed)
NAME:  Bryan Pham, Bryan Pham NO.:  [id number]   MEDICAL RECORD NO.:  [id number]          PATIENT TYPE:  INP   LOCATION:  6706                         FACILITY:  [facility]   PHYSICIAN:  Pierce Crane, MD        DATE OF BIRTH:  10-04-1929   DATE OF CONSULTATION:  07/15/2009  DATE OF DISCHARGE:                                 CONSULTATION   DATE OF CONSULTATION:  July 15, 2009.   REQUESTING PHYSICIAN:  [facility].   REASON FOR CONSULTATION:  Rule out multiple myeloma.   HISTORY OF PRESENT ILLNESS:  Bryan Pham is a 77 year old white male  admitted to [facility] with acute on chronic renal failure and  generalized weakness, as well as unsteady gait for 3 weeks, accompanied  by failure to thrive, and a 20-pound weight loss over the last 2 months.  Workup included UPEP and SPEP without immunofixation, which results are  mostly pending.  He was found to have elevated free kappa light chains  of 10.9 and free lambda light chains of 2.13 with kappa lambda ratio of  5.12.  It is important to mention that the patient has anemia in the  setting of acute on chronic disease, no acute blood loss, but H. pylori.  No occult blood.  His sed rate is slightly elevated at 56, ANA was  positive, but the titer was negative.  His calcium was normal.  He has  no proteinuria.  We were asked to see him, with recommendations.   PAST MEDICAL HISTORY:  1. Chronic kidney disease diagnosed in April of 2010, baseline      creatinine 1.8.  2. Hypertension.  3. Diabetes type 2.  4. Hyperlipidemia.  5. Anemia of chronic disease.  6. Prior alcohol history.  7. CAD with 60% of LAD.  8. Left bundle branch block.   SURGERY:  Status post cardiac catheterization in June 2004.   ALLERGIES:  BENADRYL AND IMDUR.   MEDICATIONS:  1. Tenormin 25 mg daily.  2. Heparin per pharmacy.  3. NovoLog as directed.  4. Prilosec 20 mg daily.  5. MiraLax  p.r.n.  6. K-Dur 40 mEq x1.  7. Zocor 20 mg  daily.   REVIEW OF SYSTEMS:  See HPI for significant positives.  The patient  denies any fever, chills, night sweats or headaches.  No confusion or  vision changes.  No dysphagia.  He does have shortness of breath on  exertion, but no cough.  No abdominal pain.  He has early association  and failure to thrive, and a 20-pound weight loss over 1 month.  No  other symptoms.  He does have dry heaves.  No blood in the stools  microscopically, no dark stools.  Denies any back pain.  No numbness or  swelling.  He does have increasing fatigue.  He had unsteady gait.  He  had pallor as well.   FAMILY HISTORY:  Mother died at 21 of old age.  Father died at 87 with  stomach cancer.   SOCIAL HISTORY:  The patient is divorced.  He has  two children.  No  tobacco history, but he had until 2006, the use of two pints of Vodka a  day for about 17 years.  Lives in [place].   PHYSICAL EXAM:  This is a 77 year old white male in no acute distress,  alert and oriented x3.  Blood pressure 106/61, pulse 64, respirations  16, temperature 98.5, pulse oximetry 98% on room air.  Weight 72 kg,  height 5 feet 9 inches.  HEENT:  Normocephalic, atraumatic.  PERRLA.  Oral cavity without lesions  or thrush.  NECK:  Supple.  No cervical or supraclavicular masses.  LUNGS:  Clear to auscultation bilaterally.  CARDIOVASCULAR:  Regular rate and rhythm without murmurs, rubs or  gallops.  ABDOMEN:  Soft, nontender.  Bowel sounds x4.  No hepatosplenomegaly.  EXTREMITIES:  No clubbing or cyanosis.  No edema.  No inguinal masses.  SKIN:  Without bruising or petechial rash.  GU/RECTAL:  Deferred.  MUSCULOSKELETAL:  No apparent spinal tenderness.  NEURO:  Nonfocal.   LABORATORIES:  Hemoglobin 9.6, hematocrit 28, white count 7.6, platelets  167, MCV 96.5, ANC 5.8, monocytes 0.9, lymphocytes 0.9, retic count  24.6, folic acid 2.9, B12 is 381, iron 63, TIBC 261, percent saturation  24, in April 2010 was 12.6  which was normal.  Sed rate 56, was 38 in  August 27.  Sodium 138, potassium 3.9, BUN 44, creatinine 2.9, glucose  118, total bilirubin 1.1, alkaline phosphatase 28, AST 29, ALT 21, total  protein 6.1, albumin 3.0, PSA 0.77, H.  pylori positive and a titer  negative antibody, but was positive x2.  UPEP and SPEP are still in  progress.   ASSESSMENT/PLAN:  Dr. Donnie Coffin has seen and evaluated the patient.  This is  a 77 year old white male, asked to see for evaluation of monoclonal  gammopathy of undetermined significance versus multiple myeloma.  The  patient has abnormal kappa and free lambda light chains.  At this time,  it is suspected monoclonal gammopathy of undetermined significance, but  we will continue to follow up on the laboratories, and once they become  available, we will proceed with further recommendations.  The patient  most likely will require a bone marrow biopsy at some point.  Thank you  very much for allowing [place] the opportunity to participate in the care of  this nice patient.      Marlowe Kays, P.A.      Pierce Crane, MD  Electronically Signed    SW/MEDQ  D:  07/16/2009  T:  07/16/2009  Job:  [id number] [id number]

## 2011-03-30 NOTE — H&P (Signed)
NAME:  Bryan Pham, Bryan Pham NO.:  [id number]   MEDICAL RECORD NO.:  [id number]          PATIENT TYPE:  INP   LOCATION:  6706                         FACILITY:  [facility]   PHYSICIAN:  Oswald Hillock, MD        DATE OF BIRTH:  Jul 11, 1929   DATE OF ADMISSION:  07/11/2009  DATE OF DISCHARGE:                              HISTORY & PHYSICAL   CHIEF COMPLAINT:  Generalized weakness and abnormal labs.   HISTORY OF PRESENT ILLNESS:  The patient is a 77 year old Caucasian male  who presents to the emergency room today after his primary care  physician asked him to do so for abnormal lab values, which the patient  described as kidneys feeling.  Apparently, the patient had lab work done  2 days back and was called with the results today by his primary care  physician.  The patient reports being sick for the last 3 weeks.  He has  had generalized weakness, dry heaves, and has had loss of appetite as  well.  He has not eaten anything in the last few days except for  drinking water.  He reports dry heaves and denies any diarrhea or any  vomiting.  He has not been taking his medications either for the last  few days.  The patient denies any chest pain, any shortness of breath,  any palpitations, any loss of consciousness, or any focal weakness of  any part of the body.   EMERGENCY ROOM COURSE:  Initial eval in the ER revealed his BUN elevated  at 88, creatinine 4.5, no baseline available in our records.  The  patient was also noted to have a potassium level of 2.8, which was  subsequently supplemented.   PAST MEDICAL HISTORY:  1. Hypertension.  2. Coronary artery disease.  3. Hyperlipidemia.  4. Diabetes mellitus.  5. History of renal insufficiency.   PAST SURGICAL HISTORY:  History of cardiac catheterization in June 2004  that revealed normal left ventricular systolic function, significant  hypertension, and moderate disease at the distal portion of the left  anterior  descending.   SOCIAL HISTORY:  The patient lives by himself, has an aide who helps him  with his ADLs.  He denies any recent history of alcohol, tobacco, or  drug use.   FAMILY HISTORY:  No history of hypertension, coronary artery disease, or  renal failure in the family.   CURRENT MEDICATIONS:  The patient does not have his medication list, but  does remember being on atenolol 25 mg daily, simvastatin unknown dose,  hydrochlorothiazide 25 mg daily, aspirin 81 mg daily, and TriCor.   ALLERGIES:  No known drug allergies.   REVIEW OF SYSTEMS:  An extensive review of systems was done, all systems  are negative except for the positives mentioned in the history of  present illness.  The patient denies any bleeding PR, had some issues  with unsteady gait, but denies any falls.  He also has had decrease in  his memory over the last few years.   PHYSICAL EXAMINATION:  VITALS:  On admission, pulse of 71,  blood  pressure 113/60, respiratory rate 18, O2 sats of 100% on room air.  GENERAL:  Elderly Caucasian male in no acute distress.  Alert and  oriented to time, place, and person.  HEENT:  No scleral icterus.  Positive pallor.  Ears negative.  Poor oral  dental hygiene.  NECK:  Supple.  No lymphadenopathy.  No JVD.  No carotid bruit.  CHEST:  Breath sounds heard bilaterally.  Good air entry.  No added  sounds.  CV:  S1 and S2 plus regular.  No gallop or rub or murmur appreciated.  ABDOMEN:  Soft, nontender, nondistended.  Bowel sounds are present.  EXTREMITIES:  No cyanosis, clubbing, or edema.  NEUROLOGIC:  Cranial nerves II through XII are grossly intact.  No focal  motor or sensory deficits noted on gross examination.   LABORATORY DATA:  His sodium was 138, potassium 2.8, chloride 110,  glucose 139, BUN 88, creatinine 4.5, CO2 was 17.   His WBC count was 10.2, hemoglobin 11.1, hematocrit 32.6, platelet count  of 242.  Urinalysis showed a pH of 6, specific gravity 1.017, negative   nitrite, negative esterase.   His EKG showed left bundle-branch block.  No previous EKGs available for  comparison.   IMPRESSION AND PLAN:  This is a case of a 77 year old Caucasian male  with history of multiple comorbidities including hypertension,  hyperlipidemia, coronary artery disease, and questionable mild renal  insufficiency who presents with generalized weakness and renal failure.  1. Acute and chronic renal failure.  Based on the patient's      presentation, the patient likely has acute on chronic renal      failure.  His symptoms of dry heaves, generalized weakness, and      loss of appetite are consistent with the uremia that he has.  We      will admit him to the telemetry service and start him on      intravenous fluids.  We will do a bladder scan to rule out any post      renal obstruction.  The patient will be monitored closely.  We will      try baseline labs including ESR, ANA, SPEP, and follow up with the      results.  We will schedule him for a renal ultrasound and followup.      Nephrology will be consulted in the morning after reviewing the      results of his initial workup.  It is very likely that the patient      has had baseline renal insufficiency and his renal failure has      progressed over the last few weeks.  2. Diabetes mellitus, type 2, control unknown.  The patient is on      Januvia and was on metformin until about a few days back when it      was discontinued.  We will hold off on his oral hypoglycemics, put      him on sliding-scale insulin, coverage with regular insulin      sensitive scale, and check a hemoglobin A1c.  3. Hyperlipidemia.  The patient is on simvastatin, dose unknown.  We      will put him empirically on 20 mg daily.  4. Coronary artery disease/hypertension.  The patient is on atenolol      and hydrochlorothiazide.  We will hold hydrochlorothiazide,      continue him on atenolol and monitor.  5. Hypokalemia, etiology unclear.   The patient received  supplementation in the ER at 20 mg of KCl to his IV fluids and      monitor his potassium levels closely.  6. Unsteady gait/falls likely secondary to his uremia.  We will      however get a PT/OT eval and follow up.  7. Anemia.  The patient does have low hemoglobin levels, likely anemia      of chronic kidney disease.  We will monitor closely and follow up.  8. Deep venous thrombosis/gastrointestinal prophylaxis.  Protonix and      subcu heparin.      Oswald Hillock, MD  Electronically Signed     BA/MEDQ  D:  07/11/2009  T:  07/12/2009  Job:  [id number]   cc:   Dr. Yetta Barre

## 2011-03-30 NOTE — Consult Note (Signed)
NAME:  Bryan Pham, PHAM NO.:  [id number]   MEDICAL RECORD NO.:  [id number]          PATIENT TYPE:  INP   LOCATION:  6706                         FACILITY:  [facility]   PHYSICIAN:  James L. Deterding, M.D.DATE OF BIRTH:  [birth year]   DATE OF CONSULTATION:  07/12/2009  DATE OF DISCHARGE:                                 CONSULTATION   REASON FOR CONSULTATION:  Increased creatinine.   HISTORY OF PRESENT ILLNESS:  The patient is a 77 year old Caucasian male  with past medical history of long-term hypertension, diabetes, and  chronic kidney disease for about 4 months who was admitted to the  hospital for weakness and increased creatinine.  The patient had been  feeling weakness for the past 3 weeks with poor appetite and had lost  about 20 pounds in the past 1-2 months.  He had been taking all his home  medication until yesterday prior to admission because his primary care  physician had found that his creatinine have increased.  On the day  before admission, his primary doctor found that his creatinine has gone  up but we don't have the lab report for creatinine and his primary care  doctor asking him to go to the ED. In ED his creatine was 4.5 and he was  admitted to the hospital for the increased creatinine.  He also has a  urinary frequency and sometimes he cannot empty the bladder after  urination.  He had no chest pain, dizziness, fever, or shortness of  breath.  He denies the recent use of alcohol, smoking, or drug abuse.  He had no diarrhea or dysuria.   ALLERGIES:  BENADRYL with reaction of shaking.   PAST MEDICAL HISTORY:  1. Hypertension for 20 years.  2. Coronary artery disease with cardiac catheterization in 2004      showing LAD 60% of stenosis.  3. Hyperlipidemia.  4. Diabetes for 7 years.  5. Chronic kidney disease for about 4 months.  6. Anemia.   HOME MEDICATIONS:  1. Atenolol 25 mg daily.  2. Hydrochlorothiazide 25 mg daily.  3. Aspirin 81 mg  daily.  4. Simvastatin 80 mg daily.  5. Januvia 100 mg daily.  6. Omeprazole 20 mg daily.  7. Enalapril 20 mg daily.   SOCIAL HISTORY:  Lives in [place] by himself in apartment and he is  divorced, has 2 children.  He had no smoking or drug use, and he had  quitted drink alcohol for 1 months and he used to drink vodka about 2  pints every day.   FAMILY HISTORY:  Father died at age of 24 from stomach cancer and mother  died at age 39 without any disease.   REVIEW OF SYSTEMS:  See HPI.   PHYSICAL EXAMINATION:  VITAL SIGNS:  Temperature 97.9, blood pressure  138/58, heart rate 61, respiration rate 18, O2 sat 98% on room air, body  weight 68.3.  GENERAL:  No acute distress.  HEENT:  EOMI and PERRL.  Clear sclerae.  NECK:  Supple.  No JVD.  LUNGS:  Clear to auscultation bilaterally.  No wheezing or  crackles.  HEART:  Regular rate and rhythm.  No murmur.  ABDOMEN:  Soft.  Bowel sounds positive.  No tenderness or distention.  EXTREMITIES:  No edema or rash.  NEURO:  Alert and oriented x3.  Cranial nerves II through XII intact.   LABORATORY DATA:  Hemoglobin 9.6, white blood cell 8.2, platelets 204.  Sodium 137, potassium 3.2, chloride 109, bicarb 19, BUN 81, creatinine  4.01, glucose 135, calcium 8.6, albumin 3, total protein 601.  Liver  function normal.  ESR 38.  Urine sodium 52.  UA, negative except for the  hyaline cast.  Bladder scan shows residual volume of urine is 294 mL.   ASSESSMENT AND PLAN:  1. Acute renal failure on chronic kidney disease.  His increase of      creatinine is likely due to the acute tubular necrosis from      hypoperfusion with volume depletion because of the poor intake in      the past several weeks and concomitant use of ACE inhibitors and      diuretics in the condition of volume depletion.  Because this is a      weekend, we cannot get the record from his primary care doctor, so      we are trying to get his record on Monday.  On admission, his       creatinine is 4.5 and after the intravenous fluids since admission,      his creatinine goes down to 4.  So, we agreed to discontinue the      ACE inhibitor and diuretics, continue to give the fluid      resuscitation.  Strict I's and O's.  also need to monitor the BMET      for the creatinine level.  Because the patient has residual urine      volume of 294 by the bladder scan and we need to do the renal      ultrasound to rule out the obstruction, may need to have the Foley      catheter if he still has high residual volume of urine. But now the      patient refused Foley catheter, and we would talk with the patient      after completion of the ultrasound.  To rule out multiple myeloma,      we will check the urine protein electrophoresis and serum protein      electrophoresis as well as intact PTH and urine protein and      creatinine.  2. Diabetes.  We will continue to use sliding scale insulin and      control his CBG.  3. Hypertension.  His blood pressure is 138/58, on atenolol.  4. Anemia.  His hemoglobin dropped from 11.1-9.6 and it may be due to      the delution with fluid resuscitation and the patient also has a      history of anemia, so the chronic kidney disease can also      contribute to this and we will check the anemia panel and stool      guaiac test to rule out GI bleeding.  5. Chronic artery disease.  The patient is on the atenolol and      aspirin.  6. Hypokalemia.  We will replete with KCl and check the BMET.      Jackson Latino, MD  Electronically Signed     ______________________________  Llana Aliment. Deterding, M.D.    ZY/MEDQ  D:  07/12/2009  T:  07/13/2009  Job:  [id number]

## 2011-04-02 NOTE — Cardiovascular Report (Signed)
NAME:  Bryan Pham, Bryan Pham NO.:  [id number]   MEDICAL RECORD NO.:  [id number]                   PATIENT TYPE:  OIB   LOCATION:  2899                                 FACILITY:  [facility]   PHYSICIAN:  Thereasa Solo. Little, M.D.              DATE OF BIRTH:  02/15/1929   DATE OF PROCEDURE:  04/16/2003  DATE OF DISCHARGE:                              CARDIAC CATHETERIZATION   INDICATIONS FOR TEST:  The patient is a 77 year old male who has a prior  history of angioplasty to the ostium of his first diagonal in 23 and again  in 1998.  He presented to my office with complaints of exertional chest pain  at about 10 minutes, relieved with rest.  His cardiogram was normal and  because of this he is brought in for outpatient cardiac catheterization.  Interestingly, he only has his chest pain when he ambulates on the  treadmill.  He can play golf and do normal activities without any  discomfort.   PROCEDURE:  The patient was prepped and draped in the usual sterile fashion  exposing the right groin.  Following local anesthetic with 1% Xylocaine the  Seldinger technique was employed and the 5-French introducer sheath was  placed into the right femoral artery.  Left and right coronary arteriography  and ventriculography in the RAO projection was performed.   During the procedure, the patient was given a total of 20 mg of IV labetalol  because of blood pressure elevations to 200/87.   RESULTS:  HEMODYNAMIC MONITORING:  Central aortic pressure was 200/87.  Left ventricular pressure was 200/21  with no aortic valve gradient noted at time of pullback.   VENTRICULOGRAPHY  Ventriculography in the RAO projection using 12 mL of contrast at 25 mL per  second revealed normal left ventricular systolic function.  Mitral valve  prolapse with no mitral regurgitation was seen.  The ejection fraction was  greater than 55%.  The end-diastolic pressure was 23.   CORONARY ANGIOGRAPHY  On fluoroscopy calcification was seen in the proximal portion of the LAD.  1. Left main normal.  2. LAD:  The LAD was a long vessel and extended down and around the apex of     the heart.  The distal portion of the LAD as it crossed the apex was     relatively small in diameter.  The proximal portion of the LAD was mildly     tortuous and had mild irregularity, but no high grade stenosis.  The mid     portion was free of disease and the distal portion at the apex had a     focal area of 70% narrowing.  At this level the vessel was about 1 mm in     diameter and about 2 cm before the end of the vessel.  The first diagonal     was free of disease and the ostium  appeared to have no restenosis.  3. Circumflex:  The circumflex gave rise to three OM vessels.  OM number one     had proximal 60% narrowing.  OM number two and OM number three were     basically free of disease and the ongoing circumflex was free of disease.  4. Right coronary artery:  The right coronary artery was a small diameter     vessel about 2 mm.  It gave rise to a PDA and a small posterolateral     branch.  There was an acute turn at the acute angle of the heart.  No     high grade stenosis was seen.   CONCLUSION:  1. Normal left ventricular systolic function.  2. Significant hypertension treated with intravenous labetalol in the     catheterization laboratory.  3. Moderate disease at the distal portion of the left anterior descending     which is not amenable to intervention and a moderate lesion in the     proximal portion of the obtuse marginal number one.   DISCUSSION:  The distal lesion in the LAD is probably culprit.  It clearly  is the most significant narrowing he has and because of its location in the  terminal portion of vessel is managed with medical therapy.                                               Thereasa Solo. Little, M.D.    ABL/MEDQ  D:  04/16/2003  T:  04/16/2003  Job:  [id number]   cc:   Cath Lab    Quita Skye. Artis Flock, M.D.  [address]  [place]  [place] [address]  Fax: [phone]

## 2011-07-13 ENCOUNTER — Other Ambulatory Visit: Payer: Self-pay | Admitting: Surgery

## 2011-08-06 ENCOUNTER — Inpatient Hospital Stay (HOSPITAL_COMMUNITY)
Admission: EM | Admit: 2011-08-06 | Discharge: 2011-08-12 | DRG: 445 | Disposition: A | Discharge status: Skilled Nursing Facility | Payer: Medicare Other | Attending: Internal Medicine | Admitting: Internal Medicine

## 2011-08-06 ENCOUNTER — Other Ambulatory Visit: Payer: Self-pay | Admitting: Internal Medicine

## 2011-08-06 ENCOUNTER — Emergency Department (HOSPITAL_COMMUNITY): Payer: Medicare Other

## 2011-08-06 DIAGNOSIS — I10 Essential (primary) hypertension: Secondary | ICD-10-CM

## 2011-08-06 DIAGNOSIS — Z888 Allergy status to other drugs, medicaments and biological substances status: Secondary | ICD-10-CM

## 2011-08-06 DIAGNOSIS — N183 Chronic kidney disease, stage 3 unspecified: Secondary | ICD-10-CM | POA: Diagnosis present

## 2011-08-06 DIAGNOSIS — E039 Hypothyroidism, unspecified: Secondary | ICD-10-CM | POA: Diagnosis present

## 2011-08-06 DIAGNOSIS — D638 Anemia in other chronic diseases classified elsewhere: Secondary | ICD-10-CM | POA: Diagnosis present

## 2011-08-06 DIAGNOSIS — E785 Hyperlipidemia, unspecified: Secondary | ICD-10-CM | POA: Diagnosis present

## 2011-08-06 DIAGNOSIS — I2510 Atherosclerotic heart disease of native coronary artery without angina pectoris: Secondary | ICD-10-CM | POA: Diagnosis present

## 2011-08-06 DIAGNOSIS — E876 Hypokalemia: Secondary | ICD-10-CM | POA: Diagnosis present

## 2011-08-06 DIAGNOSIS — R1011 Right upper quadrant pain: Secondary | ICD-10-CM

## 2011-08-06 DIAGNOSIS — Z7982 Long term (current) use of aspirin: Secondary | ICD-10-CM

## 2011-08-06 DIAGNOSIS — E119 Type 2 diabetes mellitus without complications: Secondary | ICD-10-CM | POA: Diagnosis present

## 2011-08-06 DIAGNOSIS — W19XXXA Unspecified fall, initial encounter: Secondary | ICD-10-CM | POA: Diagnosis not present

## 2011-08-06 DIAGNOSIS — F1010 Alcohol abuse, uncomplicated: Secondary | ICD-10-CM | POA: Diagnosis present

## 2011-08-06 DIAGNOSIS — I129 Hypertensive chronic kidney disease with stage 1 through stage 4 chronic kidney disease, or unspecified chronic kidney disease: Secondary | ICD-10-CM | POA: Diagnosis present

## 2011-08-06 DIAGNOSIS — C9000 Multiple myeloma not having achieved remission: Secondary | ICD-10-CM | POA: Diagnosis present

## 2011-08-06 DIAGNOSIS — K810 Acute cholecystitis: Secondary | ICD-10-CM

## 2011-08-06 DIAGNOSIS — N179 Acute kidney failure, unspecified: Secondary | ICD-10-CM | POA: Diagnosis present

## 2011-08-06 DIAGNOSIS — E860 Dehydration: Secondary | ICD-10-CM | POA: Diagnosis present

## 2011-08-06 DIAGNOSIS — R5381 Other malaise: Secondary | ICD-10-CM | POA: Diagnosis present

## 2011-08-06 DIAGNOSIS — I498 Other specified cardiac arrhythmias: Secondary | ICD-10-CM | POA: Diagnosis not present

## 2011-08-06 DIAGNOSIS — R5383 Other fatigue: Secondary | ICD-10-CM | POA: Diagnosis present

## 2011-08-06 DIAGNOSIS — Z794 Long term (current) use of insulin: Secondary | ICD-10-CM

## 2011-08-06 LAB — COMPREHENSIVE METABOLIC PANEL
AST: 23 U/L (ref 0–37)
Albumin: 3.2 g/dL — ABNORMAL LOW (ref 3.5–5.2)
Alkaline Phosphatase: 109 U/L (ref 39–117)
BUN: 36 mg/dL — ABNORMAL HIGH (ref 6–23)
Chloride: 97 mEq/L (ref 96–112)
Potassium: 3.9 mEq/L (ref 3.5–5.1)
Total Protein: 6.9 g/dL (ref 6.0–8.3)

## 2011-08-06 LAB — CBC
HCT: 37.1 % — ABNORMAL LOW (ref 39.0–52.0)
MCHC: 33.7 g/dL (ref 30.0–36.0)
Platelets: 123 10*3/uL — ABNORMAL LOW (ref 150–400)
RDW: 12.9 % (ref 11.5–15.5)
WBC: 17.5 10*3/uL — ABNORMAL HIGH (ref 4.0–10.5)

## 2011-08-06 LAB — DIFFERENTIAL
Basophils Absolute: 0 10*3/uL (ref 0.0–0.1)
Basophils Relative: 0 % (ref 0–1)
Eosinophils Absolute: 0 10*3/uL (ref 0.0–0.7)
Eosinophils Relative: 0 % (ref 0–5)
Lymphocytes Relative: 7 % — ABNORMAL LOW (ref 12–46)
Monocytes Absolute: 1.5 10*3/uL — ABNORMAL HIGH (ref 0.1–1.0)

## 2011-08-06 LAB — LIPASE, BLOOD: Lipase: 16 U/L (ref 11–59)

## 2011-08-07 ENCOUNTER — Inpatient Hospital Stay (HOSPITAL_COMMUNITY): Payer: Medicare Other

## 2011-08-07 LAB — CBC
HCT: 34.3 % — ABNORMAL LOW (ref 39.0–52.0)
MCHC: 33.8 g/dL (ref 30.0–36.0)
MCV: 93.7 fL (ref 78.0–100.0)
Platelets: 104 10*3/uL — ABNORMAL LOW (ref 150–400)
RDW: 13 % (ref 11.5–15.5)

## 2011-08-07 LAB — APTT: aPTT: 41 seconds — ABNORMAL HIGH (ref 24–37)

## 2011-08-07 LAB — COMPREHENSIVE METABOLIC PANEL
Albumin: 2.6 g/dL — ABNORMAL LOW (ref 3.5–5.2)
BUN: 36 mg/dL — ABNORMAL HIGH (ref 6–23)
Calcium: 8.6 mg/dL (ref 8.4–10.5)
Creatinine, Ser: 1.84 mg/dL — ABNORMAL HIGH (ref 0.50–1.35)
Total Protein: 5.9 g/dL — ABNORMAL LOW (ref 6.0–8.3)

## 2011-08-07 LAB — CK TOTAL AND CKMB (NOT AT ARMC)
CK, MB: 1.9 ng/mL (ref 0.3–4.0)
Total CK: 31 U/L (ref 7–232)

## 2011-08-07 LAB — PROTIME-INR: INR: 1.15 (ref 0.00–1.49)

## 2011-08-07 LAB — TROPONIN I: Troponin I: <0.3 ng/mL (ref <0.30)

## 2011-08-07 LAB — SURGICAL PCR SCREEN
MRSA, PCR: NEGATIVE
Staphylococcus aureus: POSITIVE — AB

## 2011-08-07 MED ORDER — IOHEXOL 300 MG/ML  SOLN
50.0000 mL | Freq: Once | INTRAMUSCULAR | Status: AC | PRN
  Administered 2011-08-07: 10 mL

## 2011-08-08 LAB — COMPREHENSIVE METABOLIC PANEL
ALT: 25 U/L (ref 0–53)
Alkaline Phosphatase: 78 U/L (ref 39–117)
CO2: 23 mEq/L (ref 19–32)
Chloride: 107 mEq/L (ref 96–112)
GFR calc Af Amer: 38 mL/min — ABNORMAL LOW (ref >60)
Glucose, Bld: 147 mg/dL — ABNORMAL HIGH (ref 70–99)
Potassium: 3.8 mEq/L (ref 3.5–5.1)
Sodium: 139 mEq/L (ref 135–145)
Total Bilirubin: 0.8 mg/dL (ref 0.3–1.2)
Total Protein: 5.5 g/dL — ABNORMAL LOW (ref 6.0–8.3)

## 2011-08-08 LAB — CBC
Hemoglobin: 10 g/dL — ABNORMAL LOW (ref 13.0–17.0)
MCHC: 33.7 g/dL (ref 30.0–36.0)
RBC: 3.1 MIL/uL — ABNORMAL LOW (ref 4.22–5.81)
WBC: 12.2 10*3/uL — ABNORMAL HIGH (ref 4.0–10.5)

## 2011-08-08 NOTE — H&P (Signed)
NAME:  Bryan Pham, Bryan Pham NO.:  [id number]  MEDICAL RECORD NO.:  [id number]  LOCATION:  [facility]                         FACILITY:  [facility]  PHYSICIAN:  Tarry Kos, MD       DATE OF BIRTH:  [birth year]  DATE OF ADMISSION:  08/06/2011 DATE OF DISCHARGE:                             HISTORY & PHYSICAL   CHIEF COMPLAINT:  Abdominal pain for 8 days, right upper quadrant.  HISTORY OF PRESENT ILLNESS:  [name] is a very pleasant 77 year old male with a history of stage III chronic kidney disease, coronary artery disease, diabetes, hypertension who presented to the ED after suffering from 8 days of right upper quadrant abdominal pain.  He has not been feeling very well at all.  He has been not been able to eat.  He has been drinking water, but he has got persistent right upper quadrant abdominal pain right below his rib cage.  He still has gallbladder.  He went to see his primary care physician who subsequently obtained a CT of his abdomen and pelvis, which shows acute cholecystitis.  He denies any fevers.  Denies any vomiting.  He is actually very stoic, and I cannot believe he is not in more pain than what he appears.  He denies any diarrhea.  Denies any abdominal pain or pain elsewhere, any radiation of the pain.  He does have a history of coronary artery disease; however, he has no history of heart failure.  He does have chronic kidney disease with a baseline creatinine of around 2.  PAST MEDICAL HISTORY: 1. Chronic kidney disease stage III with a baseline creatinine of     around 2. 2. Coronary artery disease with a history of heart failure. 3. Diabetes. 4. Multiple myeloma. 5. Chronic anemia. 6. Nonobstructive coronary artery disease. 7. Hypertension. 8. History of left bundle-branch block. 9. Hyperlipidemia. 10.Diabetes. 11.Hypothyroidism.  ALLERGIES:  BENADRYL causes shaking.  SOCIAL HISTORY:  He is a nonsmoker.  Denies alcohol.  No IV drug  abuse.  MEDICATIONS: 1. Hydrocodone. 2. Calcitriol 0.5 mcg a day. 3. Simvastatin 40 mg daily. 4. Loratadine 10 mg daily. 5. Omeprazole 40 mg twice a day. 6. Sodium bicarbonate 650 mg 2 tablets twice a day. 7. Nitroglycerin sublingual as needed. 8. Levothroid 25 mcg daily. 9. Flovent 220 mcg 2 puffs each nose twice a day. 10.Januvia 100 mg daily. 11.Atenolol 25 mg daily. 12.Aspirin 81 mg daily.  PHYSICAL EXAMINATION:  VITAL SIGNS:  Temperature is 98.1, blood pressure 116/72, pulse 66, respiration 15, 98% O2 sats on room air. GENERAL:  The patient is alert and oriented x4, in no apparent stress, cooperative, friendly. HEENT:  Extraocular muscles are intact.  Pupils are equal and reactive to light.  Oropharynx is clear.  Mucous membranes are moist. NECK:  No JVD, no carotid bruit. CARDIAC:  Regular rate and rhythm without murmurs, rubs, or gallops. CHEST:  Clear to auscultation bilaterally.  No wheezing, rhonchi, or rales. ABDOMEN:  Soft, nondistended.  Tenderness to palpation in the right upper quadrant with some rigidity.  No rebound, no guarding.  Nonacute abdomen. EXTREMITIES:  No clubbing, cyanosis, or edema. PSYCH:  Normal affect. NEURO:  No focal  neurological deficits.  Cranial II through XII grossly intact.  LABORATORY DATA:  Lipase is normal.  Total bili is elevated at 1.7, BUN and creatinine are 36 and 2.22, glucose 157, sodium is 135.  LFTs are normal.  Alk phos is normal.  White count is 17.5, hemoglobin is 12.5. A 12-lead EKG shows normal sinus rhythm without any acute ST-T wave changes but a very poor quality.  ASSESSMENT AND PLAN:  This is an 78 year old male with acute cholecystitis. 1. Acute cholecystitis.  The patient's medical issues are stable at     this point, would proceed with surgery.  We will cover him with     Invanz . 2. History of coronary artery disease.  We will check one set of     cardiac enzymes, but again, he is not having any chest pain.   I     think his gallbladder needs to be removed.  Hold his aspirin.     Continue his beta-blocker perioperatively. 3. Diabetes.  We will place him on sliding scale insulin. 4. Hypertension is stable. 5. Mild dehydration.  IV fluids. 6. Chronic kidney disease with an acute exacerbation.  His creatinine     is only mildly or more elevated than what he normally is.  We will     provide some IV fluids prior to surgery.  Monitor his I's and O's     and daily weights closely. 7. Further recommendation depending on overall hospital course.          ______________________________ Tarry Kos, MD     RD/MEDQ  D:  08/06/2011  T:  08/06/2011  Job:  [id number]  Electronically Signed by Tarry Kos MD on 08/08/2011 10:49:09 AM

## 2011-08-09 LAB — CBC
MCH: 31.7 pg (ref 26.0–34.0)
MCV: 94.7 fL (ref 78.0–100.0)
Platelets: 172 10*3/uL (ref 150–400)
RBC: 3.22 MIL/uL — ABNORMAL LOW (ref 4.22–5.81)

## 2011-08-09 LAB — COMPREHENSIVE METABOLIC PANEL
AST: 26 U/L (ref 0–37)
CO2: 20 mEq/L (ref 19–32)
Calcium: 7.9 mg/dL — ABNORMAL LOW (ref 8.4–10.5)
Creatinine, Ser: 1.53 mg/dL — ABNORMAL HIGH (ref 0.50–1.35)
GFR calc Af Amer: 53 mL/min — ABNORMAL LOW (ref >60)
GFR calc non Af Amer: 44 mL/min — ABNORMAL LOW (ref >60)
Glucose, Bld: 112 mg/dL — ABNORMAL HIGH (ref 70–99)

## 2011-08-09 LAB — GLUCOSE, CAPILLARY: Glucose-Capillary: 124 mg/dL — ABNORMAL HIGH (ref 70–99)

## 2011-08-10 ENCOUNTER — Inpatient Hospital Stay (HOSPITAL_COMMUNITY): Payer: Medicare Other

## 2011-08-10 LAB — CBC
MCV: 93.8 fL (ref 78.0–100.0)
Platelets: 200 10*3/uL (ref 150–400)
RDW: 12.8 % (ref 11.5–15.5)
WBC: 11.6 10*3/uL — ABNORMAL HIGH (ref 4.0–10.5)

## 2011-08-10 LAB — BASIC METABOLIC PANEL
Calcium: 8.2 mg/dL — ABNORMAL LOW (ref 8.4–10.5)
Chloride: 106 mEq/L (ref 96–112)
Creatinine, Ser: 1.55 mg/dL — ABNORMAL HIGH (ref 0.50–1.35)
GFR calc Af Amer: 52 mL/min — ABNORMAL LOW (ref >60)
GFR calc non Af Amer: 43 mL/min — ABNORMAL LOW (ref >60)

## 2011-08-10 LAB — GLUCOSE, CAPILLARY
Glucose-Capillary: 149 mg/dL — ABNORMAL HIGH (ref 70–99)
Glucose-Capillary: 135 mg/dL — ABNORMAL HIGH (ref 70–99)

## 2011-08-11 LAB — BASIC METABOLIC PANEL
BUN: 30 mg/dL — ABNORMAL HIGH (ref 6–23)
Calcium: 8.5 mg/dL (ref 8.4–10.5)
Creatinine, Ser: 1.56 mg/dL — ABNORMAL HIGH (ref 0.50–1.35)
GFR calc Af Amer: 52 mL/min — ABNORMAL LOW (ref >60)
GFR calc non Af Amer: 43 mL/min — ABNORMAL LOW (ref >60)

## 2011-08-11 LAB — CBC
HCT: 31.2 % — ABNORMAL LOW (ref 39.0–52.0)
MCH: 31.4 pg (ref 26.0–34.0)
MCHC: 33.3 g/dL (ref 30.0–36.0)
MCV: 94.3 fL (ref 78.0–100.0)
RDW: 12.8 % (ref 11.5–15.5)

## 2011-08-12 LAB — BASIC METABOLIC PANEL
BUN: 27 mg/dL — ABNORMAL HIGH (ref 6–23)
Chloride: 109 mEq/L (ref 96–112)
Glucose, Bld: 132 mg/dL — ABNORMAL HIGH (ref 70–99)
Potassium: 4.4 mEq/L (ref 3.5–5.1)

## 2011-08-12 NOTE — Consult Note (Signed)
[name], Pham NO.:  [id number]  MEDICAL RECORD NO.:  [id number]  LOCATION:  5523                         FACILITY:  [facility]  PHYSICIAN:  Bryan Pham. Bryan Pham, M.D.DATE OF BIRTH:  1929-05-23  DATE OF CONSULTATION:  08/06/2011 DATE OF DISCHARGE:                                CONSULTATION   REASON FOR CONSULTATION:  Cholecystitis.  HISTORY OF PRESENT ILLNESS:  [name] is an 77 year old white male who complains of right-sided abdominal pain for approximately 8 days.  The patient claims he was hoping it would go away.  He eventually did see his primary physician today, Dr. Nicholos Pham at [facility].  Dr. Nicholos Pham sent him for a CT scan of the abdomen and pelvis, this showed acute cholecystitis.  The patient was then directed to the emergency department.  PAST MEDICAL HISTORY:  Chronic renal insufficiency and he sees Dr. Darrick Pham at [facility].  He also has a history of coronary artery disease with previous angioplasties though he claims he has no stents in place.  He has history of hypertension and hypothyroidism as well.  PAST SURGICAL HISTORY:  Back surgery.  SOCIAL HISTORY:  Does not smoke, does not drink, does not use drugs.  He is a retired Counsellor.  He lives alone.  REVIEW OF SYSTEMS:  GI:  He has the abdominal pain as above.  In addition, he has been unable to eat but he has been tolerating liquids. He denies nausea and vomiting.  CARDIAC:  He is not currently having any chest pain.  PULMONARY:  No shortness of breath or other complaints. GU:  No complaints.  NEUROPSYCH:  No complaints.  CONSTITUTIONAL:  No complaints.  Remainder of the system review is unremarkable.  MEDICATIONS AT HOME:  Aspirin, atenolol, hydrochlorothiazide, and omeprazole.  ALLERGIES:  BENADRYL causing unknown reaction.  PHYSICAL EXAMINATION:  VITAL SIGNS:  Temperature 98.1, heart rate 63, blood pressure 138/66, respirations 20,  saturations 97%. HEENT:  Ears are clear.  Nares are patent.  Mouth oral mucosa is dry. NECK:  Trachea is in the midline.  There is no tenderness. LUNGS:  Clear to auscultation bilaterally with normal respiratory effort.  No wheezing is present. CARDIAC:  Normal S1-S2.  Impulse is vaguely palpable in the left chest. Distal pulses are 1+.  He has no significant peripheral edema. ABDOMEN:  Soft.  It is nondistended.  He does have tenderness in the right upper quadrant with intermittent voluntary guarding.  Bowel sounds are hypoactive.  No masses or hernias are felt. LYMPH:  No significant lymphadenopathy in the cervical, supraclavicular, or periumbilical region.  No inguinal lymphadenopathy was noted either. SKIN:  Scattered nevi and keratotic lesions but is warm and dry. NEUROLOGIC:  He is awake and alert.  Speech is fluent.  He moves extremities to command.  LABORATORY STUDIES:  Sodium 135, potassium 3.9, chloride 97, CO2 25, BUN 36, creatinine 2.22, glucose 157.  White blood cell count 17,500, hemoglobin 12.5, platelets 123,000.  AST 23, ALT 51, alkalinephosphatase 109, bilirubin 1.7.  Diagnostics chest x-ray shows questionable pulmonary nodule and slight elevation of right hemidiaphragm but no acute cardiopulmonary process.  CT scan of the abdomen and  pelvis was done at [facility].  The patient has a CD with him that also has report showing acute cholecystitis with some surrounding inflammation of the right upper quadrant, tiny gallstones are also seen.  IMPRESSION: 1. Acute cholecystitis. 2. Coronary artery disease. 3. Chronic renal insufficiency. 4. Hypertension.  PLAN: 1. Agree with medical admission for treatment of medical issues and     preoperative evaluation and clearance. 2. Hold aspirin. 3. IV antibiotics and I believe the ED physician has ordered Invanz. 4. We will follow him and plan cholecystectomy once he is medically     stable. 5. Recommend repeating CMET  and CBC in the a.m. 6. The patient n.p.o.     Bryan Pham. Bryan Pham, M.D.     BET/MEDQ  D:  08/06/2011  T:  08/07/2011  Job:  [id number]  cc:   Bryan Pham, M.D.  Electronically Signed by Bryan Pham M.D. on 08/12/2011 01:10:34 PM

## 2011-08-17 NOTE — Consult Note (Signed)
[name] Pham, Bryan Pham NO.:  [id number]  MEDICAL RECORD NO.:  [id number]  LOCATION:  5523                         FACILITY:  [facility]  PHYSICIAN:  Thereasa Solo. Little, M.D. DATE OF BIRTH:  1929/07/24  DATE OF CONSULTATION:  08/09/2011 DATE OF DISCHARGE:                                CONSULTATION   REASON FOR CONSULTATION:  Preoperative clearance.  HISTORY OF PRESENT ILLNESS:  Bryan Pham is an 77 year old male who has been followed by Dr. Clarene Duke.  He has a history of coronary artery disease.  He has had previous angioplasties in the 90s to his diagonal. He did have a heart catheterization in 2004 that shows a small diameter LAD with a distal 70% stenosis that was treated medically.  Dr. Fredirick Maudlin notes indicate the patient had a catheterization again in 2007 and that was unchanged and it was treated medically.  He did have a Myoview in our office in December 2010 that showed no ischemia with an ejection fraction of 53%.  He is admitted now with acute cholecystitis.  He underwent percutaneous tube placement on August 07, 2011.  We were asked to see him now for preoperative clearance.  He denies any chest pain or unusual shortness of breath.  He has not used nitroglycerin.  He is fairly inactive at home, but does live by myself and still drives.  PAST MEDICAL HISTORY:  Remarkable for type 2 diabetes.  He has stage III chronic renal insufficiency with a creatinine in the 2.0 range, he has been followed by Dr. Fayrene Fearing Deterding.  He has chronic left bundle-branch block.  He has treated hypothyroidism.  He has treated diabetes and treated dyslipidemia.  There was a questionable diagnosis of multiple myeloma versus monoclonal gammopathy in August 2010.  The patient was seen by Dr. Pierce Crane.  The patient says he had a bone marrow biopsy and was told this was "okay."  He had a falling out with Dr. Donnie Coffin after this and has not been back to see him in followup.  He has had  remote back surgery.  HOME MEDICATIONS: 1. Flovent 2 puffs b.i.d. p.r.n. 2. Sodium bicarb 2 tablets b.i.d. 3. Atenolol 25 mg a day. 4. Januvia 100 mg daily. 5. Simvastatin 40 mg a day. 6. Nitroglycerin p.r.n. 7. Hydrocodone p.r.n. 8. Aspirin 81 mg 2 tablets at bedtime. 9. Omeprazole 40 mg b.i.d. 10.Loratadine 10 mg daily p.r.n. 11.Levothyroxine 0.025 mg daily. 12.Calcitriol 0.5 mcg daily.  ALLERGIES:  He has no known drug allergies, but is intolerant to BENADRYL.  SOCIAL HISTORY:  He is divorced, he has two children that live in [place]. He is a nonsmoker.  Per his old records, he is probably an alcoholic. He was a heavy drinker until around 2006.  The patient denies to me that he drinks, but Dr. Fredirick Maudlin office notes from August 2012 indicate he is still drinking at least a couple glasses of alcohol a day.  FAMILY HISTORY:  Remarkable that his father died of stomach cancer in his 73s, his mother lived to be in her 15s.  REVIEW OF SYSTEMS:  Essentially unremarkable except for noted above, he has intermittent lower extremity edema for which he  takes diuretics p.r.n..  He lives in an apartment.  He says he has some help, but would not elaborate on this with me.  PHYSICAL EXAMINATION:  VITAL SIGNS:  Blood pressure 146/58, pulse 80, temperature 99.2. GENERAL:  He is a chronically ill-appearing elderly male in no acute distress. HEENT:  Normocephalic, atraumatic.  Extraocular movements intact. Sclerae nonicteric. NECK:  Without JVD or bruit.  Thyroid is not enlarged. CHEST:  Diminished breath sounds at the right base, he does have some scattered expiratory wheezing. CARDIAC:  Regular rate and rhythm with 1/6 systolic murmur at the left sternal border.  Normal S1 and S2. ABDOMEN:  Mildly distended.  Bowel sounds are diminished.  He has a percutaneous gallbladder tube in place. EXTREMITIES:  Without edema.  Distal pulses are 2+/4.  There are no femoral bruits noted. NEURO:   Grossly intact.  He is awake, alert, oriented, and cooperative. SKIN:  Cool and dry.  LABORATORY DATA:  White count 11.8, hemoglobin 10.2, hematocrit 30.5, platelets 172.  Sodium 134, potassium 3.5, BUN 40, creatinine 1.53.  EKG shows sinus rhythm.  Poor anterior R-wave progression.  It is kind of a difficult EKG to read because of baseline irregularity.  Chest x-ray on admission showed a right apical density.  CT scan without contrast is recommended for followup.  There is mild elevation of the right hemidiaphragm.  IMPRESSION: 1. Preoperative clearance. 2. Acute cholecystitis, status post percutaneous tube placement on     August 07, 2011. 3. Known coronary artery disease, previous diagonal intervention in     1997, 1998 with catheterizations in 2004 and 2007 showing small     vessel disease, treated medically. 4. Low-risk Myoview December 2010 with good LV function. 5. Type 2 non-insulin-dependent diabetes. 6. Stage III chronic renal insufficiency followed by Dr. Darrick Penna. 7. Treated hypertension. 8. Treated hypothyroidism. 9. Questionable history of multiple myeloma, the patient was evaluated     by Dr. Donnie Coffin in 2010. 10.Treated dyslipidemia. 11.Suspected alcoholism.  PLAN:  We will go ahead and repeat [name]' EKG.  He will be seen by the cardiologist today and further recommendations and clearance will be pending their evaluation.     Bryan Pham, P.A.   ______________________________ Thereasa Solo. Little, M.D.    Bryan Pham  D:  08/09/2011  T:  08/09/2011  Job:  [id number]  Electronically Signed by Corine Shelter P.A. on 08/10/2011 12:51:23 PM Electronically Signed by Julieanne Manson M.D. on 08/17/2011 11:30:26 AM

## 2011-08-26 NOTE — Progress Notes (Signed)
NAME:  Bryan Pham, Bryan Pham NO.:  [id number]  MEDICAL RECORD NO.:  [id number]  LOCATION:  5523                         FACILITY:  [facility]  PHYSICIAN:  Baltazar Najjar, MD     DATE OF BIRTH:  1929-08-12                                PROGRESS NOTE   PRIMARY CARE PHYSICIAN: Quita Skye. Artis Flock, MD  NEPHROLOGIST: Llana Aliment Deterding, MD  CARDIOLOGIST: Thereasa Solo. Little, MD  WORKING DIAGNOSES: 1. Acute cholecystitis status post percutaneous biliary tube placement     on August 07, 2011. 2. Coronary artery disease with a history of heart failure, cleared     for surgery. 3. Acute on chronic kidney disease with a baseline creatinine of 2. 4. Bradycardic episodes with pulses in the 40s. 5. Falling and weakness. 6. Alcohol abuse. 7. Diabetes mellitus. 8. Hypertension. 9. Fall.  HISTORY AND BRIEF HOSPITAL COURSE: 1. Mr. Barocio is an 77 year old Caucasian gentleman with history     listed above, who presented to the emergency department with right     upper quadrant pain on August 06, 2011.  He was complaining that     he had had the pain for 8 days and had not been able to eat because     of it.  He went see his primary care physician who obtained a CT of     his abdomen and pelvis which showed acute cholecystitis.  He came     to the emergency department and was [facility] for further evaluation and workup.  [facility] were called and Dr. Violeta Gelinas saw the     patient in consultation.  His impression was the patient did need     medical admission for preoperative evaluation and clearance for his     acute cholecystitis.  He also recommended that the IV antibiotics     be continued.  [facility] followed the patient     throughout his stay and recommended that he receive a percutaneous     cholecystostomy tube via Interventional Radiology.  The patient did     have a percutaneous  cholecystostomy tube placed by Dr. Deanne Coffer on     August 07, 2011.  This was done without complication.  As of     today, the percutaneous cholecystostomy tube is draining well     approximately 30-40 mL of dark brown fluid.  He has been changed     from IV antibiotics to p.o. Cipro which he will be on for 2 weeks.     He has been instructed to follow up with Dr. Almond Lint of     [facility] approximately 4 weeks after discharge to     schedule a cholecystectomy. 2. Coronary artery disease with history of hypertension and heart     failure.  Dr. Nanetta Batty saw the patient in consultation on     August 09, 2011, for potential cardiac clearance for his     cholecystectomy.  The patient underwent a 2-D echocardiogram that     showed an LVEF of 40%-45% with diffuse hypokinesis and  mixed     systolic and diastolic heart failure.  After reviewing the patient     as well as his echocardiogram and EKG, Dr. Allyson Sabal did clear the     patient for surgery.  Further with regards to his blood pressure and pulse rate, the patient was noted on telemetry to have several bradycardic episodes with pulse rates down into the low 40s.  He also had an episode of falling during this hospital admission.  Consequently, [facility] was asked to comment on his bradycardic episodes and elevated blood pressure.  They advised that his atenolol should be discontinued and that Norvasc should be started.  They noted that the patient was not currently on an ACE or an ARB secondary to his chronic kidney disease. They did not recommend starting one at this time.  We appreciate [facility]'s recommendations.  The patient will follow in the office with Dr. Clarene Duke after discharged from hospital.  1. Acute on chronic kidney disease.  The patient has a baseline     creatinine of approximately 2 on admission.  His creatinine was     slightly elevated at 2.22 during  his hospitalization and has     gradually come down and today, August 10, 2011, is 1.55.  The     patient follows with Dr. Fayrene Fearing Deterding. 2. Weakness and falling.  During his stay on the night of August 06, 2011, the patient was in the restroom.  He did have a fall as     he stood up from using the toilet.  He fell into the door, states     that he lost his balance.  He did have a CT of his head that was     negative.  The patient reported to the physical therapist that he     has become weaker and occasionally loses his balance at home.     Consequently, Physical Therapy did a full evaluation and recommends     that the patient be discharged to skilled nursing facility for     further physical therapy and rehabilitation with the hopes that he     will one day be able to return to his own home. 3. Diabetes mellitus.  The patient's CBGs have been well controlled     during this admission in the low 100s. 4. Hypertension.  The patient's blood pressure has risen somewhat     during this hospitalization.  Today, his systolic is in the 160s     with the diastolic in the 70s.  Again, [facility] has addressed his blood pressure.  We are discontinuing     his atenolol and starting Norvasc and he will follow up in the     office with Dr. Clarene Duke after his hospitalization is complete. 5. Alcohol abuse.  The patient has a long-term history of alcohol     abuse and still drinks.  He has been encouraged to stop drinking. 6. Hyperlipidemia, for which, he is currently on Simvastatin 40 mg     p.o. daily. 7. Hypothyroidism, for which, he is currently on 25 mcg of Synthroid     q.a.m. 8. History of possible multiple myeloma, for which, he was evaluated     by Dr. Pierce Crane in 2010.  PHYSICAL EXAMINATION: GENERAL:  At the time of this dictation, the patient is alert and oriented. VITALS:  Temperature 98.2, pulse 56, respirations  20, blood pressure 165/72, O2 sats  97% on room air. HEENT:  Head is atraumatic, normocephalic.  Eyes are anicteric with pupils that are equal and round.  Nose shows no nasal discharge or exterior lesions.  Mouth has moist mucous membranes with moderate dentition. NECK:  Supple with midline trachea.  No JVD.  No lymphadenopathy. CHEST:  No accessory muscle use.  Today, he has no wheezes or crackles to my exam. HEART:  Regular rate and rhythm, although slightly bradycardic without murmur, rub, or gallop. ABDOMEN:  Slightly distended, tender to palpation particularly in the area of his drain.  He has excellent bowel sounds. EXTREMITIES:  No clubbing, cyanosis, or edema.  He does have a percutaneous drain in the right upper quadrant of his abdomen.  There are no signs of erythema or swelling around the drain.  He does have approximately 40 mL of dark brown fluid in the drain.  PERTINENT LABORATORY DATA: On admission, the patient had a white count of 17.5, hemoglobin 12.5, hematocrit 37.1, platelets 123.  The patient had a PTT of 41, INR of 1.15, and protime of 14.9.  BMET shows sodium 135, potassium 3.9, chloride 97, bicarb 25, glucose 157, BUN 36, creatinine 1.22, total bilirubin 1.7.  Cardiac enzymes were cycled x1 and found to be within normal limits.  Today, August 10, 2011, the patient's BMET is as follows: sodium 134, potassium 3.8, chloride 106, bicarb 21, glucose 125, BUN 35, creatinine 1.55, calcium 8.2.  WBC 11.6, hemoglobin 10.7, hematocrit 31.7, platelet 200.  RADIOLOGICAL EXAMINATION: The patient had a two view of his chest on August 06, 2011, that showed small nodular ovale density in the right apex, may be in position of shadows, but pulmonary parenchymal nodule is also a possibility.  The patient had a percutaneous cholecystostomy tube placement with ultrasound and fluoroscopic guidance.  This was done on August 07, 2011, by Dr. Oley Balm.  Finally, as a result of a fall, the patient had a  CT of his head without contrast on August 10, 2011. There was no evidence of traumatic intracranial injury or fracture.  CURRENT MEDICATIONS: 1. Norvasc 10 mg 1 tablet p.o. daily. 2. Cipro 500 mg p.o. b.i.d. 3. Insulin sliding scale t.i.d. with meals. 4. Albuterol 1-2 puffs inhaled q.4 h. p.r.n. wheeze. 5. Flovent 2 puffs inhaled b.i.d. p.r.n. 6. Hydromorphone injection 0.5-0.75 IV q.1 h. p.r.n. 7. Synthroid 25 mcg 1 tablet every morning. 8. Omeprazole 40 mg 1 tablet p.o. daily.  DISPOSITION: Because of his weakness and falls as well as his other medical comorbidities, the patient is considering discharge to SNF.  He should be ready and stable for discharge to SNF in the next 24-48 hours.     Stephani Police, PA   ______________________________ Baltazar Najjar, MD    MLY/MEDQ  D:  08/10/2011  T:  08/10/2011  Job:  [id number]  cc:   Quita Skye. Artis Flock, M.D. Fax: [phone]  Llana Aliment. Deterding, M.D. Fax: [phone]  Thereasa Solo. Little, M.D. Fax: [phone]  Electronically Signed by Algis Downs PA on 08/18/2011 05:11:01 PM Electronically Signed by Hannah Beat MD on 08/26/2011 07:58:41 AM

## 2011-08-26 NOTE — Discharge Summary (Signed)
NAME:  Bryan Pham, Bryan Pham NO.:  [id number]  MEDICAL RECORD NO.:  [id number]  LOCATION:  5523                         FACILITY:  [facility]  PHYSICIAN:  Zannie Cove, MD     DATE OF BIRTH:  06/30/1929  DATE OF ADMISSION:  08/06/2011 DATE OF DISCHARGE:  08/12/2011                        DISCHARGE SUMMARY - REFERRING   PRIMARY CARE PHYSICIAN:  Quita Skye. Artis Flock, MD  NEPHROLOGIST:  Llana Aliment Deterding, MD  CARDIOLOGIST:  Thereasa Solo. Little, MD  DISCHARGE DIAGNOSES: 1. Acute cholecystitis status post percutaneous biliary colostomy tube     placement, August 07, 2011. 2. Coronary artery disease with a history of heart failure, cleared     for surgery by Dr. Nanetta Batty of [facility]. 3. Acute on chronic kidney disease with a baseline creatinine of 2. 4. Bradycardic episodes with pulses in the 40s, now off beta blockers. 5. Falling and weakness. 6. Alcohol abuse. 7. Diabetes mellitus. 8. Hypertension.  CONSULTATIONS: 1. [facility], Dr. Violeta Gelinas. 2. [facility], Dr. Nanetta Batty.  HISTORY AND BRIEF HOSPITAL COURSE: 1. Bryan Pham is a very pleasant 77 year old Caucasian gentleman with     history listed above who presented to the emergency department with     right upper quadrant pain on August 06, 2011.  He had not been     able to eat and had been in severe pain for 8 days.  He went to see     his primary care physician who obtained a CT of his abdomen and     pelvis which showed acute cholecystitis.  He came to the emergency     department and [facility] was called for further evaluation     and workup.  [facility] surgeon, Dr. Violeta Gelinas, saw the     patient in consultation and felt that he needed a period of time     for his cholecystitis to calm down and then he would be eligible     for cholecystectomy.  He was seen the following day by Dr.  Cyndia Bent who felt that given the duration of the patient's     symptoms if his cholecystectomy was done too soon he could risk     having an open cholecystectomy rather than a laparoscopic     cholecystectomy.  Consequently, the course of action pursued was     that a cholecystostomy tube was placed by Dr. Deanne Coffer of     Interventional Radiology on August 07, 2011.  This was done     without complication and the patient was changed to oral Cipro     which he should be on for 2 weeks.  He has been instructed to     follow up with Dr. Almond Lint at [facility] an     appointment has been scheduled for August 30, 2011 at 1:00 p.m.     At this time, they will review the status of his gallbladder and     determine an appropriate time for cholecystectomy.  2. Coronary artery disease with a history of hypertension and heart  failure.  Dr. Nanetta Batty saw the patient in consultation on     August 09, 2011 and cleared him for cholecystectomy.  The     patient did undergo a 2-D echocardiogram that showed an LVEF of 40-     45% with diffuse hypokinesis and mixed systolic and diastolic heart     failure.  After reviewing the patient's clinical status, his     echocardiogram, and his EKG, Dr. Allyson Sabal did feel he was safe for     surgery.  3.  The patient was noted to have multiple episodes of bradycardia with pulse rate dropping down into the 40s.  Consequently, [facility] advised that he be taken off his atenolol and then Norvasc 10 mg daily be started.  They noted that the patient was not currently on an ACE inhibitor or an ARB secondary to his chronic kidney disease and they did not recommend starting one at this time.  4. Acute on chronic kidney disease.  The patient has a baseline     creatinine of approximately 2.  His creatinine did rise to 2.22     during this hospitalization and has come gradually down.  It is     1.55 on August 10, 2011.  The patient follows with Dr. Fayrene Fearing     Deterding.  5. Weakness and fall.  The patient had been complaining of     weakness that was progressive in nature.  This was attributed to     his acute cholecystitis.  However, on the night of August 06, 2011, the patient was using the rest room, when he stood up he fell     against the door and lost his balance and slowly slid to the floor.     He hit his head.  A CT of his head was obtained and found to be     negative for any acute injury.  The patient had no further falls     during this hospitalization. Physical Therapy did a full evaluation during his admission and they recommended that the patient be discharged to a skilled nursing facility for short-term rehabilitation and therapy.  6. Diabetes mellitus.  The patient's CBGs have been well controlled     throughout this admission and have remained in the low 100s.  7. Hypertension.  The patient's blood pressure has risen somewhat     during this hospitalization today, however, his systolic is in the     120s-140s and his diastolic is in the 50s-60s.  So, on his new     regimen of Norvasc he is doing rather well.  He should follow with     Dr. Clarene Duke after his hospitalization is complete.  8. Alcohol abuse.  The patient has a long-term history of alcohol     abuse and still drinks.  He has been encouraged to stop drinking     and has committed to do so.  9. Hyperlipidemia.  The patient is currently on simvastatin 40 mg p.o.     daily.  10. Hypothyroidism.  The patient is currently on 25 mcg of Synthroid     daily.  11. History of possible multiple myeloma for which he was evaluated by     Dr. Pierce Crane in 2010 and there has been no further workup.  PHYSICAL EXAMINATION:  GENERAL:  Today, the patient is alert and oriented, appears well, sitting in the chair. VITAL SIGNS:  Temperature 99.0,  pulse 72, respirations 19, blood pressure 124/53, and O2 sats 97% on room  air. HEAD:  Atraumatic and normocephalic. EYES:  Anicteric with pupils that are equal and round. NOSE:  No nasal discharge or exterior lesions. MOUTH:  Moderate dentition with moist mucous membranes. NECK:  Supple with midline trachea.  No JVD.  No lymphadenopathy. CHEST:  No accessory muscle use.  No wheezes or crackles to my exam. HEART:  Regular rate and rhythm.  He has had occasional episodes of very short runs of V-tach, 5-7 beats on telemetry, however, these have been asymptomatic and he has got a slight systolic murmur on exam. ABDOMEN:  Soft, tender to palpation, particularly around the drain.  His bandage is clean and dry.  There is no sign of erythema or infection at the drain site.  The drain is draining a minimal amount of light brown fluid, approximately 10-15 mL per day. EXTREMITIES:  No clubbing, cyanosis, or edema. PSYCHIATRIC:  The patient is alert and oriented.  Demeanor is pleasant and cooperative.  Grooming is moderate.  The patient tells me at this point in time that he is slightly depressed because he does not want to wait 4-6 weeks to have his surgery.  He is afraid he will be in rehab for a longer period of time.  PERTINENT LABORATORY DATA:  Today, August 12, 2011, the patient's BMET is as follows:  Sodium 136, potassium 4.4, chloride 109, bicarb 19, glucose 132.  BUN 27, creatinine 1.58, and calcium 8.5.  The patient's last CBC was done on August 11, 2011 that showed WBC 10.7, hemoglobin 10.4, hematocrit 31.2, and platelets 228.  RADIOLOGIC DATA:  The patient did have a CT of his head on August 10, 2011 that showed no evidence of acute traumatic intracranial injury.  He also had a two-view of his chest that showed a small oval nodular density in the right apex which may be in position of shadows but pulmonary parenchymal nodule is also a possibility.  CT scan of the chest without contrast was recommended to further evaluate particularly if the  patient is symptomatic.  DISCHARGE MEDICATIONS: 1. Acetaminophen 325 mg 1 tablet by mouth every 4 hours as needed for     pain. 2. Albuterol 90 mcg HFA 1-2 puffs inhaled over 4 hours as needed for     wheeze. 3. Amlodipine 10 mg 1 tablet by mouth daily. 4. Ciprofloxacin 500 mg 1 tablet by mouth twice daily, take for 12     days.  His last dose is to be given on August 25, 2011. 5. Aspirin 81 mg 2 tablets by mouth daily at bedtime. 6. Calcitriol 0.5 mcg 1 tablet daily by mouth. 7. Fluticasone 220 mcg inhaler, 2 puffs inhaled twice daily. 8. Hydrocodone/acetaminophen 5/500 one tablet by mouth 3 times a day     as needed, take for 5 days. 9. Januvia 100 mg 1 tablet by mouth daily. 10.Loratadine 10 mg 1 tablet by mouth daily as needed. 11.Levothyroxine 25 mcg 1 tablet by mouth daily every morning. 12.Nitroglycerin 0.4 mg sublingual 1 tablet by mouth every 5 minutes     up to 3 doses. 13.Omeprazole 40 mg 1 tablet by mouth twice daily. 14.Simvastatin 40 mg 1 tablet by mouth daily.  The patient was taken off atenolol.  DISCHARGE INSTRUCTIONS:  The patient is being discharged to skilled nursing facility.  ACTIVITY:  May be increased slowly and will be per Physical Therapy at the skilled nursing facility.  DIET:  Low-fat heart-healthy.  WOUND CARE:  He does have a drain in place.  The output needs to be monitored and the external area around the drain needs to be kept clean, however, the drain does not need to be flushed.  FOLLOWUP APPOINTMENTS: 1. He is to see Dr. Luz Brazen of [facility] on August 30, 2011, arrive at 1:00 p.m. for 1:30     appointment. 2. We would also recommend the patient see Dr. Julieanne Manson in 1-2     weeks time to assess his arrhythmias and blood pressure.     Stephani Police, PA   ______________________________ Zannie Cove, MD    MLY/MEDQ  D:  08/12/2011  T:  08/12/2011  Job:  [id number]  cc:   Quita Skye. Artis Flock, M.D. James L.  Deterding, M.D. Thereasa Solo. Little, M.D.  Electronically Signed by Algis Downs PA on 08/21/2011 08:07:24 PM Electronically Signed by Zannie Cove  on 08/26/2011 02:01:11 PM

## 2011-08-30 ENCOUNTER — Encounter (INDEPENDENT_AMBULATORY_CARE_PROVIDER_SITE_OTHER): Payer: Self-pay | Admitting: General Surgery

## 2011-08-30 ENCOUNTER — Ambulatory Visit (INDEPENDENT_AMBULATORY_CARE_PROVIDER_SITE_OTHER): Payer: Medicare Other | Admitting: General Surgery

## 2011-08-30 VITALS — BP 148/72 | HR 64 | Temp 96.7°F | Resp 16 | Ht 70.0 in | Wt 163.2 lb

## 2011-08-30 DIAGNOSIS — K819 Cholecystitis, unspecified: Secondary | ICD-10-CM

## 2011-08-30 NOTE — Patient Instructions (Signed)

## 2011-08-30 NOTE — Assessment & Plan Note (Signed)
Pt doing better after perc chole tube 9/22. Finished antibiotics. Plan lap chole.  The surgical procedure was described to the patient in detail.  The patient was given Agricultural engineer. .  I discussed the incision type and location, the location of the gallbladder, the anatomy of the bile ducts and arteries, and the typical progression of surgery.  I discussed the possibility of converting to an open operation.  I advised of the risks of bleeding, infection, damage to other structures (such as the bile duct, intestine or liver), bile leak, need for other procedures or surgeries, and post op diarrhea/constipation.  We discussed the risk of blood clot.  We discussed the recovery period and post operative restrictions.  The patient was advised against taking blood thinners the week before surgery.    We will get this on in 4-6 weeks after perc chole tube placed.

## 2011-08-30 NOTE — Progress Notes (Signed)
Chief Complaint  Patient presents with  . Other    new pt eval of Chole     HISTORY: Patient 77 year old male who was seen as an inpatient while he was admitted to [facility]. He had had pain for 8 days and a percutaneous cholecystostomy tube was placed. He is now feeling completely better. He does not have fevers and chills. He now has nausea and vomiting. He no longer has abdominal pain. He has not been having trouble with the drain. He finished his antibiotics yesterday. He does not have chest pain or shortness of breath. He gets around well at home.  Past Medical History  Diagnosis Date  . Arthritis   . Diabetes mellitus   . Hyperlipidemia   . Hypertension   . Leg swelling     left leg   . Abdominal pain 10/12    pt was having abdominal pain over whole abdominal area     Past Surgical History  Procedure Date  . Back surgery 2009    Current Outpatient Prescriptions  Medication Sig Dispense Refill  . aspirin 81 MG tablet Take 81 mg by mouth daily.        Marland Kitchen atenolol (TENORMIN) 25 MG tablet Take 25 mg by mouth daily.        . calcitRIOL (ROCALTROL) 0.5 MCG capsule Take 0.5 mcg by mouth daily.        . FUROSEMIDE PO Take 100 mg by mouth daily.        Marland Kitchen HYDROcodone-acetaminophen (VICODIN) 5-500 MG per tablet Take 1 tablet by mouth every 6 (six) hours as needed.        Marland Kitchen levothyroxine (SYNTHROID, LEVOTHROID) 25 MCG tablet Take 25 mcg by mouth daily.        Marland Kitchen omeprazole (PRILOSEC) 40 MG capsule Take 40 mg by mouth 2 (two) times daily.        . simvastatin (ZOCOR) 40 MG tablet Take 40 mg by mouth at bedtime.        . sitaGLIPtin (JANUVIA) 100 MG tablet Take 100 mg by mouth daily.        . sodium bicarbonate 650 MG tablet Take 650 mg by mouth 2 (two) times daily.           No Known Allergies   History reviewed. No pertinent family history. Niece had cholecystectomy   History   Social History  . Marital Status: Divorced    Spouse Name: N/A    Number of Children:  N/A  . Years of Education: N/A   Social History Main Topics  . Smoking status: Former Games developer  . Smokeless tobacco: Never Used  . Alcohol Use: No  . Drug Use: No  . Sexually Active:    Other Topics Concern  . None   Social History Narrative  . None     REVIEW OF SYSTEMS - PERTINENT POSITIVES ONLY: 12 point review of systems negative other than HPI and PMH except for leg swelling.  EXAM: Filed Vitals:   08/30/11 1401  BP: 148/72  Pulse: 64  Temp: 96.7 F (35.9 C)  Resp: 16    Gen:  No acute distress.  Well nourished and well groomed.   Neurological: Alert and oriented to person, place, and time. Coordination normal.  Head: Normocephalic and atraumatic.  Eyes: Conjunctivae are normal. Pupils are equal, round, and reactive to light. No scleral icterus.  Neck: Normal range of motion. Neck supple. No tracheal deviation or thyromegaly present.  Cardiovascular: Normal rate,  intact distal pulses. Respiratory: Effort normal.  No respiratory distress. No chest wall tenderness.  GI: Soft. Bowel sounds are normal. The abdomen is soft and nontender.  There is no rebound and no guarding. Cholecystostomy drain in place with clear bile.   Musculoskeletal: Normal range of motion. Extremities are nontender.  Lymphadenopathy: No cervical, preauricular, postauricular or axillary adenopathy is present Skin: Skin is warm and dry. No rash noted. No diaphoresis. No erythema. No pallor. No clubbing, cyanosis, or edema.   Psychiatric: Normal mood and affect. Behavior is normal. Judgment and thought content normal.    LABORATORY RESULTS: Available labs are reviewed in echart.     RADIOLOGY RESULTS: See E-Chart or I-Site for most recent results.  Images and reports are reviewed.   No problem-specific assessment & plan notes found for this encounter.     Maudry Diego MD Surgical Oncology, General and Endocrine Surgery [facility]      Visit Diagnoses: No  diagnosis found.  Primary Care Physician: Georgianne Fick, MD, MD

## 2011-09-06 ENCOUNTER — Other Ambulatory Visit (INDEPENDENT_AMBULATORY_CARE_PROVIDER_SITE_OTHER): Payer: Self-pay | Admitting: General Surgery

## 2011-09-06 ENCOUNTER — Encounter (HOSPITAL_COMMUNITY): Payer: Medicare Other

## 2011-09-06 LAB — CBC
HCT: 32.5 % — ABNORMAL LOW (ref 39.0–52.0)
Hemoglobin: 10.4 g/dL — ABNORMAL LOW (ref 13.0–17.0)
MCV: 95.6 fL (ref 78.0–100.0)
RBC: 3.4 MIL/uL — ABNORMAL LOW (ref 4.22–5.81)
RDW: 13.3 % (ref 11.5–15.5)
WBC: 7.3 10*3/uL (ref 4.0–10.5)

## 2011-09-06 LAB — COMPREHENSIVE METABOLIC PANEL
Albumin: 3.5 g/dL (ref 3.5–5.2)
Alkaline Phosphatase: 70 U/L (ref 39–117)
BUN: 17 mg/dL (ref 6–23)
Calcium: 9.8 mg/dL (ref 8.4–10.5)
GFR calc Af Amer: 37 mL/min — ABNORMAL LOW (ref >90)
Potassium: 4.8 mEq/L (ref 3.5–5.1)
Total Protein: 6.4 g/dL (ref 6.0–8.3)

## 2011-09-06 LAB — DIFFERENTIAL
Basophils Relative: 1 % (ref 0–1)
Eosinophils Relative: 9 % — ABNORMAL HIGH (ref 0–5)
Lymphocytes Relative: 13 % (ref 12–46)
Monocytes Relative: 11 % (ref 3–12)
Neutrophils Relative %: 66 % (ref 43–77)

## 2011-09-06 LAB — URINALYSIS, ROUTINE W REFLEX MICROSCOPIC
Glucose, UA: NEGATIVE mg/dL
Hgb urine dipstick: NEGATIVE
Leukocytes, UA: NEGATIVE
Specific Gravity, Urine: 1.012 (ref 1.005–1.030)
Urobilinogen, UA: 0.2 mg/dL (ref 0.0–1.0)

## 2011-09-06 LAB — PROTIME-INR
INR: 1.06 (ref 0.00–1.49)
Prothrombin Time: 14 seconds (ref 11.6–15.2)

## 2011-09-06 LAB — SURGICAL PCR SCREEN: Staphylococcus aureus: POSITIVE — AB

## 2011-09-07 ENCOUNTER — Ambulatory Visit (HOSPITAL_COMMUNITY): Payer: Medicare Other

## 2011-09-07 ENCOUNTER — Other Ambulatory Visit (INDEPENDENT_AMBULATORY_CARE_PROVIDER_SITE_OTHER): Payer: Self-pay | Admitting: General Surgery

## 2011-09-07 ENCOUNTER — Ambulatory Visit (HOSPITAL_COMMUNITY)
Admission: RE | Admit: 2011-09-07 | Discharge: 2011-09-09 | Disposition: A | Discharge status: Home / Self Care | Payer: Medicare Other | Source: Ambulatory Visit | Attending: General Surgery | Admitting: General Surgery

## 2011-09-07 DIAGNOSIS — R1011 Right upper quadrant pain: Secondary | ICD-10-CM | POA: Insufficient documentation

## 2011-09-07 DIAGNOSIS — N289 Disorder of kidney and ureter, unspecified: Secondary | ICD-10-CM | POA: Insufficient documentation

## 2011-09-07 DIAGNOSIS — Z01812 Encounter for preprocedural laboratory examination: Secondary | ICD-10-CM | POA: Insufficient documentation

## 2011-09-07 DIAGNOSIS — K8000 Calculus of gallbladder with acute cholecystitis without obstruction: Secondary | ICD-10-CM

## 2011-09-07 DIAGNOSIS — E785 Hyperlipidemia, unspecified: Secondary | ICD-10-CM | POA: Insufficient documentation

## 2011-09-07 DIAGNOSIS — Z79899 Other long term (current) drug therapy: Secondary | ICD-10-CM | POA: Insufficient documentation

## 2011-09-07 DIAGNOSIS — E119 Type 2 diabetes mellitus without complications: Secondary | ICD-10-CM | POA: Insufficient documentation

## 2011-09-07 DIAGNOSIS — Z7982 Long term (current) use of aspirin: Secondary | ICD-10-CM | POA: Insufficient documentation

## 2011-09-07 DIAGNOSIS — I10 Essential (primary) hypertension: Secondary | ICD-10-CM | POA: Insufficient documentation

## 2011-09-07 HISTORY — PX: CHOLECYSTECTOMY: SHX55

## 2011-09-07 LAB — BASIC METABOLIC PANEL
CO2: 25 mEq/L (ref 19–32)
GFR calc non Af Amer: 30 mL/min — ABNORMAL LOW (ref >90)
Glucose, Bld: 147 mg/dL — ABNORMAL HIGH (ref 70–99)
Potassium: 4.3 mEq/L (ref 3.5–5.1)
Sodium: 135 mEq/L (ref 135–145)

## 2011-09-07 LAB — GLUCOSE, CAPILLARY: Glucose-Capillary: 139 mg/dL — ABNORMAL HIGH (ref 70–99)

## 2011-09-07 NOTE — H&P (Signed)
Chief Complaint   Patient presents with   .  Other       new pt eval of Chole      HISTORY: Patient 77 year old male who was seen as an inpatient while he was admitted to [facility]. He had had pain for 8 days and a percutaneous cholecystostomy tube was placed. He is now feeling completely better. He does not have fevers and chills. He now has nausea and vomiting. He no longer has abdominal pain. He has not been having trouble with the drain. He finished his antibiotics yesterday. He does not have chest pain or shortness of breath. He gets around well at home.    Past Medical History   Diagnosis  Date   .  Arthritis     .  Diabetes mellitus     .  Hyperlipidemia     .  Hypertension     .  Leg swelling         left leg    .  Abdominal pain  10/12       pt was having abdominal pain over whole abdominal area        Past Surgical History   Procedure  Date   .  Back surgery  2009       Current Outpatient Prescriptions   Medication  Sig  Dispense  Refill   .  aspirin 81 MG tablet  Take 81 mg by mouth daily.           Marland Kitchen  atenolol (TENORMIN) 25 MG tablet  Take 25 mg by mouth daily.           .  calcitRIOL (ROCALTROL) 0.5 MCG capsule  Take 0.5 mcg by mouth daily.           .  FUROSEMIDE PO  Take 100 mg by mouth daily.           Marland Kitchen  HYDROcodone-acetaminophen (VICODIN) 5-500 MG per tablet  Take 1 tablet by mouth every 6 (six) hours as needed.           Marland Kitchen  levothyroxine (SYNTHROID, LEVOTHROID) 25 MCG tablet  Take 25 mcg by mouth daily.           Marland Kitchen  omeprazole (PRILOSEC) 40 MG capsule  Take 40 mg by mouth 2 (two) times daily.           .  simvastatin (ZOCOR) 40 MG tablet  Take 40 mg by mouth at bedtime.           .  sitaGLIPtin (JANUVIA) 100 MG tablet  Take 100 mg by mouth daily.           .  sodium bicarbonate 650 MG tablet  Take 650 mg by mouth 2 (two) times daily.             No Known Allergies  History reviewed. No pertinent family history. Niece had cholecystectomy       History       Social History   .  Marital Status:  Divorced       Spouse Name:  N/A       Number of Children:  N/A   .  Years of Education:  N/A       Social History Main Topics   .  Smoking status:  Former Games developer   .  Smokeless tobacco:  Never Used   .  Alcohol Use:  No   .  Drug Use:  No   .  Sexually Active:         Other Topics  Concern   .  None       Social History Narrative   .  None      REVIEW OF SYSTEMS - PERTINENT POSITIVES ONLY: 12 point review of systems negative other than HPI and PMH except for leg swelling.   EXAM: Filed Vitals:     08/30/11 1401   BP:  148/72   Pulse:  64   Temp:  96.7 F (35.9 C)   Resp:  16    Gen:  No acute distress.  Well nourished and well groomed.   Neurological: Alert and oriented to person, place, and time. Coordination normal.  Head: Normocephalic and atraumatic.   Eyes: Conjunctivae are normal. Pupils are equal, round, and reactive to light. No scleral icterus.  Neck: Normal range of motion. Neck supple. No tracheal deviation or thyromegaly present.  Cardiovascular: Normal rate, intact distal pulses. Respiratory: Effort normal.  No respiratory distress. No chest wall tenderness.  GI: Soft. Bowel sounds are normal. The abdomen is soft and nontender.  There is no rebound and no guarding. Cholecystostomy drain in place with clear bile.   Musculoskeletal: Normal range of motion. Extremities are nontender.  Lymphadenopathy: No cervical, preauricular, postauricular or axillary adenopathy is present Skin: Skin is warm and dry. No rash noted. No diaphoresis. No erythema. No pallor. No clubbing, cyanosis, or edema.   Psychiatric: Normal mood and affect. Behavior is normal. Judgment and thought content normal.    LABORATORY RESULTS: Available labs are reviewed in echart.  Pt with renal insufficiency   RADIOLOGY RESULTS: See E-Chart or I-Site for most recent results.  Images and reports are reviewed.   Cholecystitis -  Assessment & Plan Note     Pt doing better after perc chole tube 9/22.  Finished antibiotics.  Plan lap chole.  The surgical procedure was described to the patient in detail. The patient was given Agricultural engineer. . I discussed the incision type and location, the location of the gallbladder, the anatomy of the bile ducts and arteries, and the typical progression of surgery. I discussed the possibility of converting to an open operation. I advised of the risks of bleeding, infection, damage to other structures (such as the bile duct, intestine or liver), bile leak, need for other procedures or surgeries, and post op diarrhea/constipation. We discussed the risk of blood clot. We discussed the recovery period and post operative restrictions. The patient was advised against taking blood thinners the week before surgery.  We will get this on in 4-6 weeks after perc chole tube placed.          Maudry Diego MD Surgical Oncology, General and Endocrine Surgery [facility]           Visit Diagnoses: No diagnosis found.   Primary Care Physician: Georgianne Fick, MD, MD

## 2011-09-08 DIAGNOSIS — I10 Essential (primary) hypertension: Secondary | ICD-10-CM

## 2011-09-08 DIAGNOSIS — R339 Retention of urine, unspecified: Secondary | ICD-10-CM

## 2011-09-08 DIAGNOSIS — I2510 Atherosclerotic heart disease of native coronary artery without angina pectoris: Secondary | ICD-10-CM

## 2011-09-08 LAB — CBC
Hemoglobin: 9.5 g/dL — ABNORMAL LOW (ref 13.0–17.0)
MCHC: 32.6 g/dL (ref 30.0–36.0)

## 2011-09-08 LAB — COMPREHENSIVE METABOLIC PANEL
ALT: 19 U/L (ref 0–53)
Albumin: 2.6 g/dL — ABNORMAL LOW (ref 3.5–5.2)
Alkaline Phosphatase: 56 U/L (ref 39–117)
GFR calc Af Amer: 44 mL/min — ABNORMAL LOW (ref >90)
Glucose, Bld: 169 mg/dL — ABNORMAL HIGH (ref 70–99)
Potassium: 4.1 mEq/L (ref 3.5–5.1)
Sodium: 130 mEq/L — ABNORMAL LOW (ref 135–145)
Total Protein: 5.1 g/dL — ABNORMAL LOW (ref 6.0–8.3)

## 2011-09-08 LAB — GLUCOSE, CAPILLARY
Glucose-Capillary: 140 mg/dL — ABNORMAL HIGH (ref 70–99)
Glucose-Capillary: 115 mg/dL — ABNORMAL HIGH (ref 70–99)
Glucose-Capillary: 102 mg/dL — ABNORMAL HIGH (ref 70–99)

## 2011-09-08 NOTE — Op Note (Signed)
NAME:  Bryan Pham, Bryan Pham NO.:  [id number]  MEDICAL RECORD NO.:  [id number]  LOCATION:  [facility]                         FACILITY:  [facility]  PHYSICIAN:  Almond Lint, MD       DATE OF BIRTH:  June 10, 1929  DATE OF PROCEDURE:  09/07/2011 DATE OF DISCHARGE:                              OPERATIVE REPORT   PREOPERATIVE DIAGNOSIS:  Cholecystitis.  POSTOPERATIVE DIAGNOSIS:  Cholecystitis.  PROCEDURE:  Laparoscopic cholecystectomy with cholangiogram and removal of external biliary drain.  SURGEON:  Almond Lint, MD.  ASSISTANTAnselm Pancoast. Zachery Dakins, M.D.  ANESTHESIA:  General and local.  FINDINGS:  Inflamed gallbladder with omental adhesions, normal cholangiogram.  SPECIMEN:  Gallbladder to Pathology.  ESTIMATED BLOOD LOSS:  Minimal.  COMPLICATIONS:  None.  DESCRIPTION OF THE PROCEDURE:  Bryan Pham was identified in the holding area and taken to the operating room where he was placed supine on the operating table.  General anesthesia was induced.  His abdomen was prepped and draped in sterile fashion.  Time-out was performed according to the surgical safety checklist.  When all was correct, we continued.  The infraumbilical skin was anesthetized with local anesthetic and a curvilinear transverse incision was made with a #11 blade approximately 1.5-2 cm in length.  The subcutaneous tissues were spread with a Kelly clamp.  The umbilical stalk was elevated with 2 Kocher clamps and midline incision was made with a #11 blade.  The 0 Vicryl pursestring suture was placed around the fascial incision.  The Roseanne Reno was introduced into the abdomen and pneumoperitoneum was achieved to a pressure of 15 mmHg.  The patient was placed into reverse Trendelenburg position and rotated to the left.  An 11 mm trocar was placed in the epigastrium after administration of local under direct visualization and two 5 mm trocars were placed in the right upper quadrant in similar fashion.   The omental adhesions were pulled down off the gallbladder fundus.  The gallbladder fundus was elevated towards the head.  The adhesions were taken down with combination of sharp and blunt dissection via omentum to the liver edge and the gallbladder.  Once the infundibulum of gallbladder was identified, this was grasped and retracted laterally.  The hook cautery was used to open up the peritoneum.  There was significant inflammation around the gallbladder infundibulum.  The cystic duct was identified and skeletonized.  This was clipped high up on the gallbladder.  The ductotomy was made with the EndoShears and a cholangiogram catheter was advanced to the abdominal wall into the duct and clipped into place and flushed easily.  The cholangiogram was shot demonstrating good filling of the right and left hepatic duct, emptying into the duodenum, and no evidence of filling defects.  The patient was placed back into reverse Trendelenburg position and the cholangiogram catheter was removed.  The cystic duct was triply clipped on the patient's side and divided.  Branch of the cystic artery was then identified and this was doubly clipped on the patient's side and singly clipped on the specimen side and transected.  The hook cautery was used to dissect the gallbladder off the gallbladder fossa.  There were several other small  branches of artery directly entering the gallbladder and these were clipped as well.  Once the gallbladder was freed up from the gallbladder fossa, this was retrieved through the umbilical incision with an EndoCatch bag.    Pneumoperitoneum was re-achieved.  There were several spots on the liver that were coagulated and the drain entry site was coagulated.  The drain was pulled out after it had been exposed. The hemostatic agent SNoW was applied to the gallbladder fossa.  A four- quadrant inspection was performed demonstrating no evidence of bleeding in the remainder of the  abdomen.  The ports were then removed under direct visualization while the pneumoperitoneum was allowed to evacuate. The pursestring suture of the umbilicus was tied down.  The skin of all incisions were closed with 4-0 Monocryl.    The drain skin sites was very inflamed and this was opened up just to make sure there was no purulent drainage.  The wounds were then cleaned, dried, and dressed with Dermabond except for the drain site, which was dressed with gauze and tape.     Almond Lint, MD     FB/MEDQ  D:  09/07/2011  T:  09/07/2011  Job:  [id number]  Electronically Signed by Almond Lint MD on 09/08/2011 03:18:26 PM

## 2011-09-09 LAB — PHOSPHORUS: Phosphorus: 2.5 mg/dL (ref 2.3–4.6)

## 2011-09-09 LAB — MAGNESIUM: Magnesium: 1.8 mg/dL (ref 1.5–2.5)

## 2011-09-09 LAB — BASIC METABOLIC PANEL
Calcium: 8.4 mg/dL (ref 8.4–10.5)
Creatinine, Ser: 1.86 mg/dL — ABNORMAL HIGH (ref 0.50–1.35)
GFR calc Af Amer: 38 mL/min — ABNORMAL LOW (ref >90)
GFR calc non Af Amer: 33 mL/min — ABNORMAL LOW (ref >90)

## 2011-09-09 LAB — GLUCOSE, CAPILLARY: Glucose-Capillary: 100 mg/dL — ABNORMAL HIGH (ref 70–99)

## 2011-10-01 ENCOUNTER — Encounter (INDEPENDENT_AMBULATORY_CARE_PROVIDER_SITE_OTHER): Payer: Medicare Other | Admitting: General Surgery

## 2011-10-15 ENCOUNTER — Ambulatory Visit (INDEPENDENT_AMBULATORY_CARE_PROVIDER_SITE_OTHER): Payer: Medicare Other | Admitting: General Surgery

## 2011-10-15 ENCOUNTER — Encounter (INDEPENDENT_AMBULATORY_CARE_PROVIDER_SITE_OTHER): Payer: Self-pay | Admitting: General Surgery

## 2011-10-15 ENCOUNTER — Other Ambulatory Visit (HOSPITAL_COMMUNITY): Payer: Self-pay | Admitting: *Deleted

## 2011-10-15 VITALS — BP 120/58 | HR 64 | Temp 97.7°F | Resp 16 | Ht 70.0 in | Wt 160.4 lb

## 2011-10-15 DIAGNOSIS — K819 Cholecystitis, unspecified: Secondary | ICD-10-CM

## 2011-10-15 MED ORDER — HYDROCODONE-ACETAMINOPHEN 5-500 MG PO TABS: 1.0000 | ORAL_TABLET | Freq: Four times a day (QID) | ORAL | Status: DC | PRN

## 2011-10-15 MED ORDER — CHOLESTYRAMINE LIGHT 4 G PO PACK: 4.0000 g | PACK | Freq: Two times a day (BID) | ORAL | Status: DC

## 2011-10-15 NOTE — Assessment & Plan Note (Signed)
S/p lap chole with IOC 09/07/2011 Having some loose stools when he eats.   Try cholestyramine.   Doing well other than back pain that has started in the last week.   Advised him to follow up with Dr. Nicholos Johns about back pain. Follow up with me prn

## 2011-10-15 NOTE — Patient Instructions (Signed)
Try cholestyramine for diarrhea.

## 2011-10-15 NOTE — Progress Notes (Signed)
HISTORY: The patient is approximately one month status post laparoscopic cholecystectomy with IOC.  He has been doing reasonably well. He has recently started having some back pain. This is primarily when he bends over. He denies nausea and vomiting. He has no difficulty with his appetite. He is a taking pain medication which he describes as taking for "hurting all over and especially with his back."He is having loose stools every time he eats. He denies fevers and chills. He denies abdominal pain.   PERTINENT REVIEW OF SYSTEMS: Otherwise negative.  EXAM: Head: Normocephalic and atraumatic.  Eyes:  Conjunctivae are normal. Pupils are equal, round, and reactive to light. No scleral icterus.  Neck:  Normal range of motion. Neck supple. No tracheal deviation present. No thyromegaly present.  Resp: No respiratory distress, normal effort. Abd:  Abdomen is soft, non distended and non tender. No masses are palpable.  There is no rebound and no guarding.  Neurological: Alert and oriented to person, place, and time. Coordination normal.  Skin: Skin is warm and dry. No rash noted. No diaphoretic. No erythema. No pallor.  Psychiatric: Normal mood and affect. Normal behavior. Judgment and thought content normal.     Pathology reviewed and demonstrates: Acute cholecystitis and cholelithiasis  ASSESSMENT AND PLAN:   Cholecystitis S/p lap chole with IOC 09/07/2011 Having some loose stools when he eats.   Try cholestyramine.   Doing well other than back pain that has started in the last week.   Advised him to follow up with Dr. Nicholos Johns about back pain. Follow up with me prn       Maudry Diego, MD Surgical Oncology, General & Endocrine Surgery [facility]  Georgianne Fick, MD, MD Georgianne Fick, MD

## 2011-10-20 ENCOUNTER — Ambulatory Visit (HOSPITAL_COMMUNITY)
Admission: RE | Admit: 2011-10-20 | Discharge: 2011-10-20 | Disposition: A | Discharge status: Home / Self Care | Payer: Medicare Other | Source: Ambulatory Visit | Attending: Nephrology | Admitting: Nephrology

## 2011-10-20 DIAGNOSIS — D509 Iron deficiency anemia, unspecified: Secondary | ICD-10-CM | POA: Insufficient documentation

## 2011-10-20 MED ORDER — FERUMOXYTOL INJECTION 510 MG/17 ML
510.0000 mg | Freq: Once | INTRAVENOUS | Status: AC
  Administered 2011-10-20: 510 mg via INTRAVENOUS

## 2011-10-20 MED ORDER — FERUMOXYTOL INJECTION 510 MG/17 ML
INTRAVENOUS | Status: AC
  Administered 2011-10-20: 510 mg via INTRAVENOUS
  Filled 2011-10-20: qty 17

## 2011-10-20 NOTE — Progress Notes (Signed)
Pt bp 180/69 at time of discharge.  Pt stated he did not take his BP meds today and would take them when he got home.

## 2012-02-01 ENCOUNTER — Other Ambulatory Visit: Payer: Self-pay | Admitting: Surgery

## 2012-06-19 ENCOUNTER — Other Ambulatory Visit: Payer: Self-pay

## 2012-07-16 DIAGNOSIS — I951 Orthostatic hypotension: Secondary | ICD-10-CM

## 2012-07-16 HISTORY — DX: Orthostatic hypotension: I95.1

## 2012-10-05 ENCOUNTER — Other Ambulatory Visit: Payer: Self-pay | Admitting: Dermatology

## 2013-04-10 ENCOUNTER — Other Ambulatory Visit: Payer: Self-pay

## 2013-04-19 ENCOUNTER — Telehealth: Payer: Self-pay | Admitting: Hematology & Oncology

## 2013-04-19 NOTE — Telephone Encounter (Signed)
Called patient to schedule appointment he wants to go to [facility] it's easier for him. I called talked to Selena Batten in medical records and sent Tiffany e-mail and faxed record to [phone]

## 2013-04-20 ENCOUNTER — Telehealth: Payer: Self-pay | Admitting: Hematology & Oncology

## 2013-04-20 NOTE — Telephone Encounter (Signed)
LVOM FOR PT TO RETURN CALL IN NP APPT.

## 2013-04-20 NOTE — Telephone Encounter (Signed)
S/W PT IN RE NP APPT 07/10 @ 10:30 W/DR. NAZIR REFERRING DR. Nicholos Johns DX- [facility] WELCOME PACKET MAILED.

## 2013-04-20 NOTE — Telephone Encounter (Signed)
C/D 04/20/13 for appt. 05/24/13

## 2013-05-15 ENCOUNTER — Encounter: Payer: Self-pay | Admitting: *Deleted

## 2013-05-17 ENCOUNTER — Encounter: Payer: Self-pay | Admitting: Internal Medicine

## 2013-05-22 ENCOUNTER — Encounter: Payer: Self-pay | Admitting: Internal Medicine

## 2013-05-22 ENCOUNTER — Ambulatory Visit (INDEPENDENT_AMBULATORY_CARE_PROVIDER_SITE_OTHER): Payer: Medicare Other | Admitting: Internal Medicine

## 2013-05-22 VITALS — BP 122/64 | HR 59 | Ht 70.0 in | Wt 163.1 lb

## 2013-05-22 DIAGNOSIS — I2510 Atherosclerotic heart disease of native coronary artery without angina pectoris: Secondary | ICD-10-CM

## 2013-05-22 DIAGNOSIS — E785 Hyperlipidemia, unspecified: Secondary | ICD-10-CM

## 2013-05-22 DIAGNOSIS — I951 Orthostatic hypotension: Secondary | ICD-10-CM

## 2013-05-22 DIAGNOSIS — C4490 Unspecified malignant neoplasm of skin, unspecified: Secondary | ICD-10-CM

## 2013-05-22 NOTE — Patient Instructions (Signed)
Your physician wants you to follow-up in: 1 year. You will receive a reminder letter in the mail two months in advance. If you don't receive a letter, please call our office to schedule the follow-up appointment.  

## 2013-05-22 NOTE — Progress Notes (Signed)
OFFICE NOTE  Chief Complaint:  Routine followup  Primary Care Physician: Georgianne Fick, MD  HPI:  ARLYNN MCDERMID  is an 79 year old gentleman with a history of coronary disease and a number of PCIs. Recently he had been having problems with orthostatic hypotension and his Lasix and atenolol were stopped and he has had marked improvement in those symptoms. He still gets a little dizzy when he gets up to go to urinate at night, probably because he springs out of bed. I told him it is important for him to sit up and then stand up and allow at least a minute or so equilibrate the blood pressures. There are probably still some signs of orthostasis. He was noted to have bradycardia recently was taken off of his atenolol. Currently is on no blood pressure medications. His blood pressure is appropriate today however is reported some days he does shoot up into the 180 systolic. Unfortunately, he recently was diagnosed with skin cancer. He's undergone multiple surgical procedures for this. It is unclear what the prognosis is. Despite this he feels well, denies any chest pain or worsening shortness of breath.  PMHx:  Past Medical History  Diagnosis Date  . Arthritis   . Diabetes mellitus   . Hyperlipidemia   . Hypertension   . Leg swelling     left leg   . Abdominal pain 10/12    pt was having abdominal pain over whole abdominal area   . CAD (coronary artery disease)     multiple PCI to diag system in 1990; echo 08/09/11- EF 40-45%, mild AR and mild MR; myoview 10/22/2009-EF 53%, defect in the inferior region, no ischemai or scar  . Orthostatic hypotension 07/2012    drop in SBP, lasix and atenolol was D/C  . Kidney disease     mild    Past Surgical History  Procedure Laterality Date  . Back surgery  2009  . Cholecystectomy  09/07/11  . Cardiac catheterization  04/16/2003    EF >55%, significant HTN, mod disease at the distal portion of the LAD which is not amenable to PCI and a mod  lesion in the prox portion of the OM 1, medical therapy  . Cardiac catheterization  06/11/1999    nl LV function, MVP, minor irregularities in the LAD and diag with no high grade stenosis  . Cardiac catheterization  06/14/1997    90% narrowing of the ostial portion of the first diag, no restenosis of prior PCI in LAD or OM 1, nl EF  . Coronary angioplasty  08/01/1996    2V CAD involving the distal LAD and OM1, angioplasty of distal LAD and the OM1    FAMHx:  Family History  Problem Relation Age of Onset  . Cancer Sister     colon    SOCHx:   reports that he has quit smoking. He has never used smokeless tobacco. He reports that he does not drink alcohol or use illicit drugs.  ALLERGIES:  Allergies  Allergen Reactions  . Benadryl (Diphenhydramine Hcl)     ROS: A comprehensive review of systems was negative except for: Constitutional: positive for weight loss Integument/breast: positive for skin color change and skin cancer  HOME MEDS: Current Outpatient Prescriptions  Medication Sig Dispense Refill  . aspirin 81 MG tablet Take 162 mg by mouth daily.       Marland Kitchen atorvastatin (LIPITOR) 40 MG tablet Take 40 mg by mouth daily.      . calcitRIOL (ROCALTROL) 0.5 MCG  capsule Take 0.5 mcg by mouth daily.        Marland Kitchen HYDROcodone-acetaminophen (VICODIN) 5-500 MG per tablet Take 1 tablet by mouth every 6 (six) hours as needed.  30 tablet  0  . levothyroxine (SYNTHROID, LEVOTHROID) 25 MCG tablet Take 25 mcg by mouth daily.        Marland Kitchen NITROSTAT 0.4 MG SL tablet Place 0.4 mg under the tongue as needed.      Marland Kitchen omeprazole (PRILOSEC) 40 MG capsule Take 40 mg by mouth 2 (two) times daily.        . sitaGLIPtin (JANUVIA) 100 MG tablet Take 100 mg by mouth daily.        . sodium bicarbonate 650 MG tablet Take 1,300 mg by mouth 2 (two) times daily.       . cholestyramine light (PREVALITE) 4 G packet Take 1 packet (4 g total) by mouth 2 (two) times daily.  60 packet  2   No current facility-administered  medications for this visit.    LABS/IMAGING: No results found for this or any previous visit (from the past 48 hour(s)). No results found.  VITALS: BP 122/64  Pulse 59  Ht 5\' 10"  (1.778 m)  Wt 163 lb 1.6 oz (73.982 kg)  BMI 23.4 kg/m2  EXAM: General appearance: alert and no distress Neck: no adenopathy, no carotid bruit, no JVD, supple, symmetrical, trachea midline and thyroid not enlarged, symmetric, no tenderness/mass/nodules Lungs: clear to auscultation bilaterally Heart: regular rate and rhythm, S1, S2 normal, no murmur, click, rub or gallop Abdomen: soft, non-tender; bowel sounds normal; no masses,  no organomegaly Extremities: 1+ edema in the left lower extremity Pulses: 2+ and symmetric Skin: multiple skin lesions, bandages from recent operations Neurologic: Grossly normal  EKG: Sinus bradycardia, left bundle branch block with LVH at 59  ASSESSMENT: 1. Coronary artery disease without angina 2. Orthostatic hypotension-resolved 3. Hyperlipidemia 4. Chronic kidney disease 5. Skin cancer  PLAN: 1.   Mr. Flemister is unfortunately now battling skin cancer. The extent and type of cancer is unknown to him or myself. From a cardiovascular standpoint, he seems to be stable off of his blood pressure and heart rate medications. He does have periods of time where his heart rate or blood pressure to shoot up, but does not report palpitations, chest pain or other shortness of breath. He does have a small amount swelling in his left leg which does go down during the day, this is likely due to venous insufficiency and could be treated with a stocking while in an upright position as necessary. We will plan to see him back annually or sooner as necessary.   Chrystie Nose, MD, [facility] Attending Cardiologist [facility]  Malvina Schadler C 05/22/2013, 1:40 PM

## 2013-05-24 ENCOUNTER — Ambulatory Visit: Payer: Medicare Other | Admitting: Lab

## 2013-05-24 ENCOUNTER — Encounter: Payer: Self-pay | Admitting: Hematology and Oncology

## 2013-05-24 ENCOUNTER — Other Ambulatory Visit: Payer: Medicare Other | Admitting: Lab

## 2013-05-24 ENCOUNTER — Telehealth: Payer: Self-pay | Admitting: Hematology and Oncology

## 2013-05-24 ENCOUNTER — Ambulatory Visit (HOSPITAL_BASED_OUTPATIENT_CLINIC_OR_DEPARTMENT_OTHER): Payer: Medicare Other | Admitting: Hematology and Oncology

## 2013-05-24 ENCOUNTER — Other Ambulatory Visit: Payer: Self-pay | Admitting: Hematology and Oncology

## 2013-05-24 ENCOUNTER — Ambulatory Visit (HOSPITAL_BASED_OUTPATIENT_CLINIC_OR_DEPARTMENT_OTHER): Payer: Medicare Other

## 2013-05-24 VITALS — BP 169/57 | HR 67 | Temp 97.6°F | Resp 18 | Ht 70.0 in | Wt 164.7 lb

## 2013-05-24 DIAGNOSIS — I10 Essential (primary) hypertension: Secondary | ICD-10-CM

## 2013-05-24 DIAGNOSIS — D649 Anemia, unspecified: Secondary | ICD-10-CM

## 2013-05-24 DIAGNOSIS — C888 Other malignant immunoproliferative diseases: Secondary | ICD-10-CM

## 2013-05-24 DIAGNOSIS — D696 Thrombocytopenia, unspecified: Secondary | ICD-10-CM

## 2013-05-24 LAB — CBC & DIFF AND RETIC
EOS%: 26.9 % — ABNORMAL HIGH (ref 0.0–7.0)
MCH: 34.2 pg — ABNORMAL HIGH (ref 27.2–33.4)
MCV: 102 fL — ABNORMAL HIGH (ref 79.3–98.0)
MONO%: 6.7 % (ref 0.0–14.0)
NEUT#: 3.1 10*3/uL (ref 1.5–6.5)
RBC: 3.45 10*6/uL — ABNORMAL LOW (ref 4.20–5.82)
RDW: 13.8 % (ref 11.0–14.6)
Retic %: 1.61 % (ref 0.80–1.80)
Retic Ct Abs: 55.55 10*3/uL (ref 34.80–93.90)

## 2013-05-24 LAB — COMPREHENSIVE METABOLIC PANEL (CC13)
AST: 41 U/L — ABNORMAL HIGH (ref 5–34)
Alkaline Phosphatase: 78 U/L (ref 40–150)
BUN: 30.2 mg/dL — ABNORMAL HIGH (ref 7.0–26.0)
Creatinine: 2 mg/dL — ABNORMAL HIGH (ref 0.7–1.3)
Total Bilirubin: 0.44 mg/dL (ref 0.20–1.20)

## 2013-05-24 LAB — TSH CHCC: TSH: 1.062 m(IU)/L (ref 0.320–4.118)

## 2013-05-24 NOTE — Telephone Encounter (Signed)
sent pt back to the lab today....and appt in 3weeks per Dr. Karel Jarvis

## 2013-05-24 NOTE — Progress Notes (Signed)
[facility]  [phone] [phone]     INITIAL HEMATOLOGY CONSULTATION    Referral MD:    Reason for Referral: MGUS and Thrombocytopenia    HPI:  We have the pleasure seen sooner him once in our clinic earlier today regarding his thrombocytopenia and MGUS. Bryan Pham is a pleasant 79 years old Caucasian male with multiple medical problems as listed below including chronic kidney disease, who was found to have monoclonal gammopathy as well as thrombocytopenia. In the clinic today patient is a component by his son, patient says that he feels well, denied any bleeding episodes, denied any bony pain, delayed nausea or vomiting, his energy level is stable and he remains active. His appetite is stable as well as his weight. Bryan Pham had serum protein electrophoresis back when January of 2011 revealed M spike of 0.25, his IgG was 817, IgA 136, IgM 38, his kappa free light chain was 2.34 and lambda free light chain was 2.46 with a ratio of 0.95. He also had a CBC done back in October of 2012 revealing a platelet count of  90,000, hemoglobin 9.5 with hematocrit of 29.1 total white blood cell count of 9.6 and  No differential was available at that time. Patient denied any alcohol abuse, no history of hepatitis C and no new medications were added to     Past Medical History  Diagnosis Date  . Arthritis   . Diabetes mellitus   . Hyperlipidemia   . Hypertension   . Leg swelling     left leg   . Abdominal pain 10/12    pt was having abdominal pain over whole abdominal area   . CAD (coronary artery disease)     multiple PCI to diag system in 1990; echo 08/09/11- EF 40-45%, mild AR and mild MR; myoview 10/22/2009-EF 53%, defect in the inferior region, no ischemai or scar  . Orthostatic hypotension 07/2012    drop in SBP, lasix and atenolol was D/C  . Kidney disease     mild  :    Past Surgical History  Procedure Laterality Date  . Back surgery   2009  . Cholecystectomy  09/07/11  . Cardiac catheterization  04/16/2003    EF >55%, significant HTN, mod disease at the distal portion of the LAD which is not amenable to PCI and a mod lesion in the prox portion of the OM 1, medical therapy  . Cardiac catheterization  06/11/1999    nl LV function, MVP, minor irregularities in the LAD and diag with no high grade stenosis  . Cardiac catheterization  06/14/1997    90% narrowing of the ostial portion of the first diag, no restenosis of prior PCI in LAD or OM 1, nl EF  . Coronary angioplasty  08/01/1996    2V CAD involving the distal LAD and OM1, angioplasty of distal LAD and the OM1  :   CURRENT MEDS: Current Outpatient Prescriptions  Medication Sig Dispense Refill  . aspirin 81 MG tablet Take 162 mg by mouth daily.       Marland Kitchen atorvastatin (LIPITOR) 40 MG tablet Take 40 mg by mouth daily.      . calcitRIOL (ROCALTROL) 0.5 MCG capsule Take 0.5 mcg by mouth daily.        Marland Kitchen HYDROcodone-acetaminophen (VICODIN) 5-500 MG per tablet Take 1 tablet by mouth every 6 (six) hours as needed.  30 tablet  0  . levothyroxine (SYNTHROID, LEVOTHROID) 25 MCG tablet Take 25  mcg by mouth daily.        Marland Kitchen omeprazole (PRILOSEC) 40 MG capsule Take 40 mg by mouth 2 (two) times daily.        . sitaGLIPtin (JANUVIA) 100 MG tablet Take 50 mg by mouth daily.       . sodium bicarbonate 650 MG tablet Take 1,300 mg by mouth 2 (two) times daily.       Marland Kitchen NITROSTAT 0.4 MG SL tablet Place 0.4 mg under the tongue as needed.       No current facility-administered medications for this visit.      Allergies  Allergen Reactions  . Benadryl (Diphenhydramine Hcl)   :  Family History  Problem Relation Age of Onset  . Cancer Sister     colon  :  History   Social History  . Marital Status: Divorced    Spouse Name: N/A    Number of Children: N/A  . Years of Education: N/A   Occupational History  . Not on file.   Social History Main Topics  . Smoking status: Former Games developer   . Smokeless tobacco: Never Used  . Alcohol Use: No  . Drug Use: No  . Sexually Active:    Other Topics Concern  . Not on file   Social History Narrative  . No narrative on file  :  REVIEW OF SYSTEM:  The rest of the 14-point review of sytem was negative.   Exam: Blood pressure 169/57, pulse 67, temperature 97.6 F (36.4 C), temperature source Oral, resp. rate 18, height 5\' 10"  (1.778 m), weight 164 lb 11.2 oz (74.707 kg).   ECOG PERFORMANCE STATUS: 1 - Symptomatic but completely ambulatory  GENERAL: No distress, well nourished.  SKIN:  No rashes or significant lesions  HEAD: Normocephalic, No masses, lesions, tenderness or abnormalities  EYES: Conjunctiva are pink and non-injected  ENT: External ears normal ,lips , buccal mucosa, and tongue normal and mucous membranes are moist  LYMPH: No palpable lymphadenopathy  BREAST:Normal without mass, skin or nipple changes or axillary nodes,  LUNGS: Clear to auscultation, no crackles or wheezes HEART: Regular rate & rhythm, no murmurs, no gallops, S1 normal and S2 normal  ABDOMEN: Abdomen soft, non-tender, normal bowel sounds, no masses or organomegaly and no hepatosplenomegaly  MSK: No CVA tenderness and no tenderness on percussion of the back or rib cage. EXTREMITIES: No edema, no skin discoloration or tenderness NEURO: Alert & oriented, no focal motor/sensory deficits.     LABS:  Lab Results  Component Value Date   WBC 5.5 05/24/2013   HGB 11.8* 05/24/2013   HCT 35.2* 05/24/2013   PLT 77* 05/24/2013   GLUCOSE 116 05/24/2013   ALT 42 05/24/2013   AST 41* 05/24/2013   NA 139 05/24/2013   K 3.9 05/24/2013   CL 103 09/09/2011   CREATININE 2.0* 05/24/2013   BUN 30.2* 05/24/2013   CO2 22 05/24/2013   PSA 0.77 Test Methodology: Hybritech PSA 07/11/2009   INR 1.06 09/06/2011   HGBA1C  Value: 7.0 (NOTE) The ADA recommends the following therapeutic goal for glycemic control related to Hgb A1c measurement: Goal of therapy: <6.5 Hgb A1c   Reference: American Diabetes Association: Clinical Practice Recommendations 2010, Diabetes Care, 2010, 33: (Suppl  1).* 11/14/2009   Results for KOLEMAN, Pham (MRN [id number]) as of 06/14/2013 10:34  Ref. Range 07/15/2009 12:00 07/15/2009 12:00 07/28/2009 14:48 11/18/2009 13:43 05/24/2013 12:27  Total Protein ELP Latest Range: 6.0-8.3 g/dL 5.6 (L)  Albumin ELP Latest Range: 55.8-66.1 % 53.1 (L)  58.7 53.0 (L) 64.4  COMMENT (PROTEIN ELECTROPHOR) No range found   * * *  Alpha-1-Globulin Latest Range: 2.9-4.9 % 6.2 (H)  4.9 9.1 (H) 4.2  Alpha-2-Globulin Latest Range: 7.1-11.8 % 12.1 (H)  12.4 (H) 14.0 (H) 10.3  Beta Globulin Latest Range: 4.7-7.2 % 7.0  5.9 6.9 6.1  Beta 2 Latest Range: 3.2-6.5 % 4.5  4.3 5.3 3.8  Gamma Globulin Latest Range: 11.1-18.8 % 17.1  13.8 11.7 11.2  M-SPIKE, % No range found 0.57  0.42 0.25 NOT DET  SPE Interp. No range found (NOTE) A restrict...  * * *  Comment No range found (NOTE) ----------...      IgG (Immunoglobdid notin G), Serum Latest Range: [phone] mg/dL 1610 [phone] 191  IgA Latest Range: 68-379 mg/dL [phone]  IgM, Serum Latest Range: 41-251 mg/dL 46 (L) 47 (L) 45 (L) 38 (L) 25 (L)  Total Protein, Serum Electrophoresis Latest Range: 6.0-8.3 g/dL   6.9 6.3 6.1  Kappa free light chain Latest Range: 0.33-1.94 mg/dL 8.46  9.62 (H)  9.52 (H)  Lambda Free Lght Chn Latest Range: 0.57-2.63 mg/dL   8.41  3.24  Lamda free light chains Latest Range: 0.57-2.63 mg/dL 4.01      Kappa, lamda light chain ratio Latest Range: 0.26-1.65  0.85      Kappa:Lambda Ratio Latest Range: 0.26-1.65    0.95  1.33    Results for HANNAN, HUTMACHER (MRN [id number]) as of 06/14/2013 10:34  Ref. Range 07/13/2009 03:25 05/24/2013 12:27  Vitamin B-12 Latest Range: 211-911 pg/mL 381 204 (L)    ASSESSMENT AND PLAN:  Bryan Pham is 9 y old male with his hrombocytopenia and MGUS. Repeat serum protein electrophoresis today and did not she will M-spike,  his IgG and IgA were within  normal limits and his IgG M. was slightly decreased. Kappa free light chain was 3.01 and lambda free light chain was 2.6 with an issue of 1.33. Giving the serum protein electrophoresis results from today, I think that as long further intervention needed and patient should be placed on active surveillance. However, I more concerned about his thrombocytopenia, especially in the setting of  MCV of 102. This is  Worrisome about the possibility of underlying bone marrow pathological process. We will however, check vitamin B12 level, folic acid, TSH, free T4, hepatitis profile. If workup is negative, and there is no obvious reason for his thrombocytopenia, we might need to perform a bone marrow biopsy for further evaluation. But before that we will bring back to the clinic in 2-3 weeks to review these results with him and determine the next step.   Of note, since I will be leaving by the beginning of August, I discussed the case of Bryan Pham with Dr. Cyndie Chime, and we reviewed the results together and the followup appointment for Bryan Pham will be with Dr. Cyndie Chime.  Also by at the time of this dictation his vitamin B12 level came back 204 which is in the lower side; therefore, and B12 supplementation was initiated.   Thank you for this referral    Zachery Dakins, MD 06/14/2013 11:07 AM

## 2013-05-24 NOTE — Progress Notes (Signed)
Checked in new patient. No financial issues. Didn't ask if POA/living will. He wants phone/mail only for communication.

## 2013-05-25 ENCOUNTER — Other Ambulatory Visit: Payer: Self-pay | Admitting: Hematology and Oncology

## 2013-05-25 ENCOUNTER — Encounter: Payer: Self-pay | Admitting: Medical Oncology

## 2013-05-28 ENCOUNTER — Telehealth: Payer: Self-pay

## 2013-05-28 LAB — PROTEIN ELECTROPHORESIS, SERUM, WITH REFLEX
Alpha-1-Globulin: 4.2 % (ref 2.9–4.9)
Alpha-2-Globulin: 10.3 % (ref 7.1–11.8)
Beta Globulin: 6.1 % (ref 4.7–7.2)
Gamma Globulin: 11.2 % (ref 11.1–18.8)

## 2013-05-28 LAB — IGG, IGA, IGM
IgA: 131 mg/dL (ref 68–379)
IgM, Serum: 25 mg/dL — ABNORMAL LOW (ref 41–251)

## 2013-05-28 LAB — IGE: IgE (Immunoglobulin E), Serum: 363.8 IU/mL — ABNORMAL HIGH (ref 0.0–180.0)

## 2013-05-28 LAB — T4, FREE: Free T4: 1.24 ng/dL (ref 0.80–1.80)

## 2013-05-28 NOTE — Telephone Encounter (Signed)
Dr. Cyndie Chime will look at his schedule and get Bryan Pham an appointment for follow up in the next few weeks as requested by Dr. Eli Phillips.

## 2013-06-13 ENCOUNTER — Telehealth: Payer: Self-pay

## 2013-06-13 NOTE — Telephone Encounter (Signed)
Told Mr. Scully that Dr. Karel Jarvis said that he did not need to keep his appointment tomorrow as he is going to be scheduled with the hematologist Dr. Cyndie Chime in the next few weeks to review his case. Our office will notify him of the appointment. Mr. Micheletti verbalized understanding.

## 2013-06-14 ENCOUNTER — Ambulatory Visit: Payer: Medicare Other

## 2013-06-14 DIAGNOSIS — D696 Thrombocytopenia, unspecified: Secondary | ICD-10-CM | POA: Insufficient documentation

## 2013-06-21 ENCOUNTER — Other Ambulatory Visit: Payer: Self-pay | Admitting: Oncology

## 2013-06-21 ENCOUNTER — Telehealth: Payer: Self-pay | Admitting: *Deleted

## 2013-06-21 DIAGNOSIS — D696 Thrombocytopenia, unspecified: Secondary | ICD-10-CM

## 2013-06-21 NOTE — Telephone Encounter (Signed)
sw pt gv appt for 07/13/13 w/ labs @ 9am and ov @ 9:30am. Pt is aware.Marland Kitchentd

## 2013-07-13 ENCOUNTER — Telehealth: Payer: Self-pay | Admitting: Dietician

## 2013-07-13 ENCOUNTER — Ambulatory Visit (HOSPITAL_BASED_OUTPATIENT_CLINIC_OR_DEPARTMENT_OTHER): Payer: Medicare Other | Admitting: Oncology

## 2013-07-13 ENCOUNTER — Other Ambulatory Visit (HOSPITAL_BASED_OUTPATIENT_CLINIC_OR_DEPARTMENT_OTHER): Payer: Medicare Other | Admitting: Lab

## 2013-07-13 VITALS — BP 161/94 | HR 72 | Temp 98.1°F | Resp 18 | Ht 70.0 in | Wt 162.9 lb

## 2013-07-13 DIAGNOSIS — D696 Thrombocytopenia, unspecified: Secondary | ICD-10-CM

## 2013-07-13 DIAGNOSIS — D649 Anemia, unspecified: Secondary | ICD-10-CM

## 2013-07-13 DIAGNOSIS — E538 Deficiency of other specified B group vitamins: Secondary | ICD-10-CM

## 2013-07-13 LAB — CBC & DIFF AND RETIC
BASO%: 1.4 % (ref 0.0–2.0)
Eosinophils Absolute: 0.5 10*3/uL (ref 0.0–0.5)
MCV: 103.5 fL — ABNORMAL HIGH (ref 79.3–98.0)
MONO#: 0.4 10*3/uL (ref 0.1–0.9)
MONO%: 8.8 % (ref 0.0–14.0)
NEUT#: 2.7 10*3/uL (ref 1.5–6.5)
RBC: 3.1 10*6/uL — ABNORMAL LOW (ref 4.20–5.82)
RDW: 13.7 % (ref 11.0–14.6)
Retic %: 1.44 % (ref 0.80–1.80)
Retic Ct Abs: 44.64 10*3/uL (ref 34.80–93.90)
WBC: 4.2 10*3/uL (ref 4.0–10.3)

## 2013-07-13 LAB — MORPHOLOGY

## 2013-07-13 MED ORDER — VITAMIN B-12 100 MCG PO TABS: 100.0000 ug | ORAL_TABLET | Freq: Every day | ORAL | Status: DC

## 2013-07-13 MED ORDER — VITAMIN B-12 1000 MCG PO TABS: 1000.0000 ug | ORAL_TABLET | Freq: Every day | ORAL | Status: DC

## 2013-07-13 NOTE — Progress Notes (Signed)
Hematology and Oncology Follow Up Visit  Bryan Pham [id number] 1929/11/03 79 y.o. 07/13/2013 6:24 PM   Principle Diagnosis: Encounter Diagnosis  Name Primary?  . Cyanocobalamin deficiency Yes     Interim History:   This is the first time I am seeing this 79 year old man formerly evaluated in this office by Dr. Pierce Crane. Recent followup visit with Dr. Karel Jarvis, a locum tenens. He has chronic macrocytic anemia, chronic thrombocytopenia, dating back at least as far as 2010. He admits to drinking 1/5 of vodka daily until just 10 days ago. He has been drinking heavily for many years. He last drank the vodka with tonic water which contains quinine. Blood counts recorded in November of 2010 showed a platelet count as low as 65,000. Average platelet count over the last 4 years around 90,000. Baseline hemoglobin 10-11 g. Of note his progressive renal insufficiency with creatinine 2.0. He is followed by nephrology. He takes bicarbonate tablets to help maintain acid-base balance. Dr. Donnie Coffin did a bone marrow biopsy 07/29/2009. The bone marrow was normal. Dr. Karel Jarvis checked a number of laboratory studies on 05/24/2013. Findings were a low B12 level, 204 (211-911), normal folic acid level, normal TSH and free T4, normal total serum IgG IgM, IgA, with suppressed IgM. IgG elevated at 364 (0-180). Trace amounts of monoclonal IgG lambda seen on some SPEPs over the last 3 years. No trend for rise in total serum immunoglobulins. Serum free light chains elevated consistent with his renal failure with normal kappa/lambda light chain ratio consistent with medical renal disease. No lytic bone lesions on bone survey done August 2010.   Some of his white count differentials showe eosinophilia up to 27%. His son who accompanies him today states that his eosinophils are also elevated. Both father and son have allergies. This probably also explains the elevated IgE immunoglobulin.  He is divorced. He makes his own meals.  He does not eat much meat. He is on no marrow toxic medications. (With the possible exception of quinine in tonic water.)  Medications: reviewed  Allergies:  Allergies  Allergen Reactions  . Benadryl [Diphenhydramine Hcl]     Review of Systems: Constitutional:   No constitutional symptoms HEENT no sore throat Respiratory: No cough or dyspnea Cardiovascular:  No chest pain or palpitations Gastrointestinal: No abdominal pain or change in bowel habit Genito-Urinary: No urinary tract symptoms Musculoskeletal: No muscle bone or joint pain Neurologic: No headache or change in vision, he denies any paresthesias Skin: No rash or ecchymosis. He has had a number of squamous cell carcinomas removed from the skin of his face by the Mohs technique Vascular: Hematology: See above Remaining ROS negative.     Physical Exam: Blood pressure 161/94, pulse 72, temperature 98.1 F (36.7 C), temperature source Oral, resp. rate 18, height 5\' 10"  (1.778 m), weight 162 lb 14.4 oz (73.891 kg). Wt Readings from Last 3 Encounters:  07/13/13 162 lb 14.4 oz (73.891 kg)  05/24/13 164 lb 11.2 oz (74.707 kg)  05/22/13 163 lb 1.6 oz (73.982 kg)     General appearance: Thin Caucasian man HENNT: Pharynx no erythema or exudate. Lymph nodes: No adenopathy Breasts: Lungs: Clear to auscultation resonant to percussion Heart: Regular rhythm no murmur Abdomen: Soft, nontender, spleen tip palpable left upper quadrant Extremities: No edema, no calf tenderness Musculoskeletal: No joint deformities GU: Vascular: No carotid bruits, no cyanosis Neurologic: Mental status intact, cranial nerves grossly normal, motor strength 5 over 5, reflexes 1+ symmetric, sensation only mildly decreased attending for exam  over the fingertips Skin: Active area of inflammation adjacent to his left eye where he has had recent Mohs surgery  Lab Results: Lab Results  Component Value Date   WBC 4.2 07/13/2013   HGB 10.6* 07/13/2013    HCT 32.1* 07/13/2013   MCV 103.5* 07/13/2013   PLT 81* 07/13/2013     Chemistry      Component Value Date/Time   NA 139 05/24/2013 1227   NA 134* 09/09/2011 0500   K 3.9 05/24/2013 1227   K 3.7 09/09/2011 0500   CL 103 09/09/2011 0500   CO2 22 05/24/2013 1227   CO2 25 09/09/2011 0500   BUN 30.2* 05/24/2013 1227   BUN 16 09/09/2011 0500   CREATININE 2.0* 05/24/2013 1227   CREATININE 1.86* 09/09/2011 0500      Component Value Date/Time   CALCIUM 9.3 05/24/2013 1227   CALCIUM 8.4 09/09/2011 0500   CALCIUM 8.5 07/12/2009 1530   ALKPHOS 78 05/24/2013 1227   ALKPHOS 56 09/08/2011 0523   AST 41* 05/24/2013 1227   AST 24 09/08/2011 0523   ALT 42 05/24/2013 1227   ALT 19 09/08/2011 0523   BILITOT 0.44 05/24/2013 1227   BILITOT 0.5 09/08/2011 0523       Impression: #1. Multifactorial anemia due to chronic, heavy, alcohol intake complicated by progressive renal dysfunction.  #2. Multifactorial thrombocytopenia also related to chronic alcohol use. Borderline splenomegaly on my exam today. Platelet counts are fluctuated over the last 4 years but no trend for progressive decrease.  #3. Borderline B12 deficiency Likely on a nutritional basis. Doubt that he has pernicious anemia.  #4. Intermittent eosinophilia with normal total white count likely related to his history of allergies.  Recommendations: #1. Attempt to stop heavy alcohol use. #2. Avoid tonic water in view of potential for quinine to exacerbate thrombocytopenia #3. Begin oral B12 replacement 1 mg daily #4. Trial of erythrocyte stimulating agents if hemoglobin consistently below 10 g in the future from progressive renal dysfunction.  I did not schedule a formal followup visit in this office at this time.  Total, direct face-to-face contact with this patient over one hour.   CC:. Dr. Nicholos Johns; Dr. Rivka Barbara, MD 8/29/20146:24 PM

## 2013-11-13 ENCOUNTER — Encounter (INDEPENDENT_AMBULATORY_CARE_PROVIDER_SITE_OTHER): Payer: Self-pay

## 2013-11-13 ENCOUNTER — Ambulatory Visit (HOSPITAL_BASED_OUTPATIENT_CLINIC_OR_DEPARTMENT_OTHER): Payer: Medicare Other | Admitting: Oncology

## 2013-11-13 VITALS — BP 164/74 | HR 72 | Temp 97.4°F | Resp 17 | Ht 70.0 in | Wt 170.1 lb

## 2013-11-13 DIAGNOSIS — D696 Thrombocytopenia, unspecified: Secondary | ICD-10-CM

## 2013-11-13 DIAGNOSIS — F1010 Alcohol abuse, uncomplicated: Secondary | ICD-10-CM

## 2013-11-13 NOTE — Progress Notes (Signed)
Hematology and Oncology Follow Up Visit  Bryan Pham [id number] [birth year] 79 y.o. 11/13/2013 12:49 PM   Principle Diagnosis: Encounter Diagnoses  Name Primary?  . Thrombocytopenia   . Thrombocytopenia, unspecified Yes     Interim History:  Pleasant 79 year old man referred back to this office for a reevaluation of chronic thrombocytopenia most likely related to ongoing alcohol use. He has now been evaluated by 2 other physicians not counting myself in this practice. He has had fluctuating decrease in his platelet count for at least 4 years. He had a normal bone marrow biopsy done by Dr. Pierce Crane on 07/29/2009. He has been a heavy alcohol user for many years. At the time of his visit with me on 07/13/2013 he admitted to drinking 1/5 of vodka daily and also he used quinine-containing tonic water. He did stop the quinine water at my advice but has now switched from vodka to scotch and is still drinking about 1 pint daily. Platelet count has fluctuated widely from a low value of 65,000 recorded on 10/13/2009 to a high value of 228,000 recorded on 08/11/2011 Average  platelet counts over the last year have run between 80 and 90,000. A blood count done on 10/17/2013 through his primary care physician's office showed a platelet count of 71,000, chronic stable hemoglobin 11 g, white count 6400 with 60% neutrophils 11 lymphocytes 20% eosinophils. Eosinophilia is also chronic and may be familial. He has mild chronic macrocytic anemia with contributions from chronic alcohol use and chronic renal insufficiency. A myeloma screen was unremarkable. Trace amounts of monoclonal protein are seen on some immunofixation electrophoresis of serum without elevations of total immunoglobulins, a normal bone marrow biopsy in 2010, and a normal skeletal bone survey in the past. Recent urinalysis 10/17/2013 negative protein negative blood. Serum total protein 6.3 albumin 4.1 globulin fraction 2.2. He has had no  clinical bleeding.   Medications: reviewed  Allergies:  Allergies  Allergen Reactions  . Benadryl [Diphenhydramine Hcl]     shaking    Review of Systems: Hematology:  See above ENT ROS: No sore throat Breast ROS:  Respiratory ROS: Recent bronchitis Cardiovascular ROS: No chest pain Gastrointestinal ROS:  No abdominal pain or change in bowel habit Genito-Urinary ROS: Not questioned Musculoskeletal ROS: No bone pain Neurological ROS: No headache or change in vision Dermatological ROS: No rash Remaining ROS negative. He reports he has a left inguinal hernia.  Physical Exam: Blood pressure 164/74, pulse 72, temperature 97.4 F (36.3 C), temperature source Oral, resp. rate 17, height 5\' 10"  (1.778 m), weight 170 lb 1.6 oz (77.157 kg), SpO2 100.00%. Wt Readings from Last 3 Encounters:  11/13/13 170 lb 1.6 oz (77.157 kg)  07/13/13 162 lb 14.4 oz (73.891 kg)  05/24/13 164 lb 11.2 oz (74.707 kg)     General appearance: Thin but adequately nourished Caucasian man HENNT: Pharynx no erythema, exudate, mass, or ulcer. No thyromegaly or thyroid nodules Lymph nodes: No cervical, supraclavicular, or axillary lymphadenopathy Breasts:  Lungs: Clear to auscultation, resonant to percussion throughout Heart: Regular rhythm, no murmur, no gallop, no rub, no click, no edema Abdomen: Soft, nontender, normal bowel sounds, no mass, spleen tip palpable in left upper quadrant no hepatomegaly Extremities: No edema, no calf tenderness Musculoskeletal: no joint deformities GU:  Vascular: Carotid pulses 2+, no bruits, distal pulses: Dorsalis pedis 1+ symmetric Neurologic: Alert, oriented, PERRLA, optic discs sharp and vessels normal, no hemorrhage or exudate, cranial nerves grossly normal, motor strength 5 over 5, reflexes 1+ symmetric, upper  body coordination normal, gait normal, Skin: No rash or ecchymosis  Lab Results: CBC W/Diff    Component Value Date/Time   WBC 4.2 07/13/2013 0903   WBC 9.6  09/08/2011 0523   RBC 3.10* 07/13/2013 0903   RBC 3.11* 09/08/2011 0523   RBC 3.07* 07/13/2009 0325   HGB 10.6* 07/13/2013 0903   HGB 9.5* 09/08/2011 0523   HCT 32.1* 07/13/2013 0903   HCT 29.1* 09/08/2011 0523   PLT 81* 07/13/2013 0903   PLT 90* 09/08/2011 0523   MCV 103.5* 07/13/2013 0903   MCV 93.6 09/08/2011 0523   MCH 34.2* 07/13/2013 0903   MCH 30.5 09/08/2011 0523   MCHC 33.0 07/13/2013 0903   MCHC 32.6 09/08/2011 0523   RDW 13.7 07/13/2013 0903   RDW 13.5 09/08/2011 0523   LYMPHSABS 0.5* 07/13/2013 0903   LYMPHSABS 0.9 09/06/2011 1215   MONOABS 0.4 07/13/2013 0903   MONOABS 0.8 09/06/2011 1215   EOSABS 0.5 07/13/2013 0903   EOSABS 0.7 09/06/2011 1215   BASOSABS 0.1 07/13/2013 0903   BASOSABS 0.1 09/06/2011 1215     Chemistry      Component Value Date/Time   NA 139 05/24/2013 1227   NA 134* 09/09/2011 0500   K 3.9 05/24/2013 1227   K 3.7 09/09/2011 0500   CL 103 09/09/2011 0500   CO2 22 05/24/2013 1227   CO2 25 09/09/2011 0500   BUN 30.2* 05/24/2013 1227   BUN 16 09/09/2011 0500   CREATININE 2.0* 05/24/2013 1227   CREATININE 1.86* 09/09/2011 0500      Component Value Date/Time   CALCIUM 9.3 05/24/2013 1227   CALCIUM 8.4 09/09/2011 0500   CALCIUM 8.5 07/12/2009 1530   ALKPHOS 78 05/24/2013 1227   ALKPHOS 56 09/08/2011 0523   AST 41* 05/24/2013 1227   AST 24 09/08/2011 0523   ALT 42 05/24/2013 1227   ALT 19 09/08/2011 0523   BILITOT 0.44 05/24/2013 1227   BILITOT 0.5 09/08/2011 0523     Note: The patient declined to get a followup CBC today   Impression:  Chronic moderate thrombocytopenia related to ongoing alcohol use. Extensive evaluation to date including bone marrow biopsy has been nondiagnostic for any other pathology. I feel a spleen tip on exam today. He is likely becoming cirrhotic. Passive congestion of the spleen an additional factor related to his decreased platelet count. I would consider getting a ultrasound of his abdomen to document splenomegaly. No further  evaluation is planned at this time.   CC: Patient Care Team: Georgianne Fick, MD as PCP - General (Internal Medicine)   Levert Feinstein, MD 12/30/201412:49 PM

## 2013-11-14 ENCOUNTER — Telehealth: Payer: Self-pay | Admitting: Oncology

## 2013-11-14 NOTE — Telephone Encounter (Signed)
Read Dr Cephas Darby POF dtd 12/30 that indicated pt should have had lab add on when here yesterday but no record of that being done.  Called pt asked to come for lab but pt states he is 79 y/o it is cold outside and he does not want to come for any labs Email sent to Dr. Reece Agar advising of same shh

## 2013-11-16 ENCOUNTER — Telehealth: Payer: Self-pay | Admitting: Oncology

## 2013-11-16 NOTE — Telephone Encounter (Signed)
Per staff message from Haskel Schroeder she responded to Dr. Synthia Innocent 12/31 staff message about this patient

## 2014-01-12 ENCOUNTER — Encounter: Payer: Self-pay | Admitting: Oncology

## 2014-05-23 ENCOUNTER — Other Ambulatory Visit (HOSPITAL_COMMUNITY): Payer: Self-pay | Admitting: Internal Medicine

## 2014-05-23 DIAGNOSIS — R0602 Shortness of breath: Secondary | ICD-10-CM

## 2014-05-27 ENCOUNTER — Ambulatory Visit (HOSPITAL_COMMUNITY)
Admission: RE | Admit: 2014-05-27 | Discharge: 2014-05-27 | Disposition: A | Discharge status: Home / Self Care | Payer: Medicare HMO | Source: Ambulatory Visit | Attending: Cardiology | Admitting: Cardiology

## 2014-05-27 DIAGNOSIS — R0602 Shortness of breath: Secondary | ICD-10-CM | POA: Insufficient documentation

## 2014-05-27 DIAGNOSIS — I359 Nonrheumatic aortic valve disorder, unspecified: Secondary | ICD-10-CM

## 2014-05-27 NOTE — Progress Notes (Signed)
2D Echo Performed 05/27/2014    Marygrace Drought, RCS

## 2014-06-08 ENCOUNTER — Telehealth: Payer: Self-pay | Admitting: Internal Medicine

## 2014-06-08 NOTE — Telephone Encounter (Signed)
Closed encounter °

## 2014-06-12 ENCOUNTER — Ambulatory Visit (INDEPENDENT_AMBULATORY_CARE_PROVIDER_SITE_OTHER): Payer: Commercial Managed Care - HMO | Admitting: Internal Medicine

## 2014-06-12 ENCOUNTER — Encounter: Payer: Self-pay | Admitting: Internal Medicine

## 2014-06-12 VITALS — BP 186/80 | HR 74 | Ht 70.0 in | Wt 148.1 lb

## 2014-06-12 DIAGNOSIS — R0602 Shortness of breath: Secondary | ICD-10-CM

## 2014-06-12 DIAGNOSIS — I951 Orthostatic hypotension: Secondary | ICD-10-CM

## 2014-06-12 DIAGNOSIS — Z79899 Other long term (current) drug therapy: Secondary | ICD-10-CM

## 2014-06-12 DIAGNOSIS — E785 Hyperlipidemia, unspecified: Secondary | ICD-10-CM

## 2014-06-12 DIAGNOSIS — I426 Alcoholic cardiomyopathy: Secondary | ICD-10-CM

## 2014-06-12 DIAGNOSIS — N183 Chronic kidney disease, stage 3 unspecified: Secondary | ICD-10-CM

## 2014-06-12 DIAGNOSIS — E039 Hypothyroidism, unspecified: Secondary | ICD-10-CM

## 2014-06-12 DIAGNOSIS — I2510 Atherosclerotic heart disease of native coronary artery without angina pectoris: Secondary | ICD-10-CM

## 2014-06-12 DIAGNOSIS — I2584 Coronary atherosclerosis due to calcified coronary lesion: Secondary | ICD-10-CM

## 2014-06-12 NOTE — Patient Instructions (Signed)
Restart your aspirin 81mg  once daily.  Restart losartan.   Your physician recommends that you return for lab work TODAY.   Your physician recommends that you schedule a follow-up appointment in: 2-3 weeks.

## 2014-06-12 NOTE — Progress Notes (Signed)
OFFICE NOTE  Chief Complaint:  Routine followup  Primary Care Physician: Bryan Seashore, MD  HPI:  Bryan Pham  is an 80 year old gentleman with a history of coronary disease and a number of PCIs. Recently he had been having problems with orthostatic hypotension and his Lasix and atenolol were stopped and he has had marked improvement in those symptoms. He still gets a little dizzy when he gets up to go to urinate at night, probably because he springs out of bed. I told him it is important for him to sit up and then stand up and allow at least a minute or so equilibrate the blood pressures. There are probably still some signs of orthostasis. He was noted to have bradycardia recently was taken off of his atenolol. Currently is on no blood pressure medications. His blood pressure is appropriate today however is reported some days he does shoot up into the 081 systolic. Unfortunately, he recently was diagnosed with skin cancer. He's undergone multiple surgical procedures for this. It is unclear what the prognosis is.   Bryan Pham was referred back today for evaluation of an abnormal echocardiogram. He denies any symptoms such as shortness of breath but recently underwent an echocardiogram which demonstrated a new cardiomyopathy. His EF was 30-35% with global hypokinesis. Again he denies any chest pain or shortness of breath however has been and continues to use alcohol very heavily. In addition he has known coronary artery disease by cardiac catheterization in 2004. It is difficult to know whether he is new cardiomyopathy is due to heavy alcohol use or possibly multivessel coronary artery disease.  PMHx:  Past Medical History  Diagnosis Date  . Arthritis   . Diabetes mellitus   . Hyperlipidemia   . Hypertension   . Leg swelling     left leg   . Abdominal pain 10/12    pt was having abdominal pain over whole abdominal area   . CAD (coronary artery disease)     multiple PCI  to diag system in 1990; echo 08/09/11- EF 40-45%, mild AR and mild MR; myoview 10/22/2009-EF 53%, defect in the inferior region, no ischemai or scar  . Orthostatic hypotension 07/2012    47mmHg drop in SBP, lasix and atenolol was D/C  . Kidney disease     mild    Past Surgical History  Procedure Laterality Date  . Back surgery  2009  . Cholecystectomy  09/07/11  . Cardiac catheterization  04/16/2003    EF >55%, significant HTN, mod disease at the distal portion of the LAD which is not amenable to PCI and a mod lesion in the prox portion of the OM 1, medical therapy  . Cardiac catheterization  06/11/1999    nl LV function, MVP, minor irregularities in the LAD and diag with no high grade stenosis  . Cardiac catheterization  06/14/1997    90% narrowing of the ostial portion of the first diag, no restenosis of prior PCI in LAD or OM 1, nl EF  . Coronary angioplasty  08/01/1996    2V CAD involving the distal LAD and OM1, angioplasty of distal LAD and the OM1    FAMHx:  Family History  Problem Relation Age of Onset  . Cancer Sister     colon    SOCHx:   reports that he has quit smoking. He has never used smokeless tobacco. He reports that he does not drink alcohol or use illicit drugs.  ALLERGIES:  Allergies  Allergen Reactions  .  Benadryl [Diphenhydramine Hcl]     shaking    ROS: A comprehensive review of systems was negative except for: Constitutional: positive for weight loss  HOME MEDS: Current Outpatient Prescriptions  Medication Sig Dispense Refill  . aspirin 81 MG tablet Take 81 mg by mouth daily.       Marland Kitchen levothyroxine (SYNTHROID, LEVOTHROID) 25 MCG tablet Take 25 mcg by mouth daily.        Marland Kitchen losartan (COZAAR) 50 MG tablet Take 50 mg by mouth daily.      Marland Kitchen omeprazole (PRILOSEC) 40 MG capsule Take 40 mg by mouth 2 (two) times daily.         No current facility-administered medications for this visit.    LABS/IMAGING: No results found for this or any previous visit (from  the past 48 hour(s)). No results found.  VITALS: BP 186/80  Pulse 74  Ht 5\' 10"  (1.778 m)  Wt 148 lb 1.6 oz (67.178 kg)  BMI 21.25 kg/m2  EXAM: General appearance: alert and no distress Neck: no adenopathy, no carotid bruit, no JVD, supple, symmetrical, trachea midline and thyroid not enlarged, symmetric, no tenderness/mass/nodules Lungs: clear to auscultation bilaterally Heart: regular rate and rhythm, S1, S2 normal, 2/6 SEM at apex, no click, rub or gallop Abdomen: soft, non-tender; bowel sounds normal; no masses,  no organomegaly Extremities: 1+ edema in the left lower extremity Pulses: 2+ and symmetric Skin: multiple skin lesions, bandages from recent operations Neurologic: Grossly normal  EKG: Normal sinus rhythm at 74  ASSESSMENT: 1. 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 Coronary artery disease without angina 3. Orthostatic hypotension-resolved 4. Hyperlipidemia 5. CKD III 6. Skin cancer 7. Weight loss 8. HTN - uncontrolled  PLAN: 1.   Bryan Pham has a new cardiomyopathy by echo showing EF of 30-35% with global hypokinesis. He does have a history of coronary artery disease which is nonobstructive by cardiac catheterization in 2004. He's had 5 prior cardiac catheterizations. He also continues to use alcohol very heavily. He denies any chest pain or shortness of breath. I suspect he may have a mixed cardiomyopathy although predominantly his symptoms may be related to ongoing alcohol use. It is difficult to exclude that he has underlying coronary artery disease, particularly multivessel coronary disease. It would be recommended that he undergo cardiac catheterization, however he has significant chronic kidney disease and has been followed by Bryan Pham with [facility].  I would recommend a repeat of his laboratory work including a conference of metabolic profile, BNP and TSH/T4. He does report taking his thyroid medication,  religiously, however he says he is not taking any of the other medications because they're either too expensive or he is just not interested in taking medications. He is aware that with his EF being less than 35% there is an increased risk of sudden cardiac death. It is difficult this time to recommend advance therapies until we know if he has a possibly reversible cause of his cardiomyopathy.  He would not qualify for a defibrillator based on his ongoing alcohol use. He said that he is interested in doing anything he can to keep himself from dying, but does not seem to be interested in decreasing his alcohol intake.  Plan to see him back to discuss results of the laboratory work and if he has significant renal dysfunction, we may have to forego cardiac catheterization as the risk of contrast nephropathy could lead to renal failure. I would recommend restarting his home losartan and we will  reassess his blood pressure when he returns. If he can tolerate the addition of a beta blocker I recommended at that time. He should remain on low-dose aspirin. By exam he appears euvolemic and his weight continues to decline. I would not recommend adding a diuretic.  Thanks for referring him back to my attention.  Pixie Casino, MD, [facility] Attending Cardiologist [facility] 06/12/2014, 4:50 PM

## 2014-06-13 LAB — TSH: TSH: 3.722 u[IU]/mL (ref 0.350–4.500)

## 2014-06-13 LAB — BASIC METABOLIC PANEL
BUN: 12 mg/dL (ref 6–23)
CALCIUM: 9.5 mg/dL (ref 8.4–10.5)
CHLORIDE: 103 meq/L (ref 96–112)
CO2: 23 meq/L (ref 19–32)
Creat: 1.68 mg/dL — ABNORMAL HIGH (ref 0.50–1.35)
Glucose, Bld: 87 mg/dL (ref 70–99)
Potassium: 4.5 mEq/L (ref 3.5–5.3)
SODIUM: 139 meq/L (ref 135–145)

## 2014-06-13 LAB — T4, FREE: Free T4: 0.91 ng/dL (ref 0.80–1.80)

## 2014-06-13 LAB — BRAIN NATRIURETIC PEPTIDE: Brain Natriuretic Peptide: 116.6 pg/mL — ABNORMAL HIGH (ref 0.0–100.0)

## 2014-06-26 ENCOUNTER — Ambulatory Visit (INDEPENDENT_AMBULATORY_CARE_PROVIDER_SITE_OTHER): Payer: Commercial Managed Care - HMO | Admitting: Internal Medicine

## 2014-06-26 ENCOUNTER — Other Ambulatory Visit: Payer: Self-pay | Admitting: Internal Medicine

## 2014-06-26 ENCOUNTER — Encounter: Payer: Self-pay | Admitting: Internal Medicine

## 2014-06-26 ENCOUNTER — Other Ambulatory Visit: Payer: Self-pay | Admitting: *Deleted

## 2014-06-26 VITALS — BP 193/92 | HR 80 | Ht 70.0 in | Wt 154.3 lb

## 2014-06-26 DIAGNOSIS — I2510 Atherosclerotic heart disease of native coronary artery without angina pectoris: Secondary | ICD-10-CM

## 2014-06-26 DIAGNOSIS — I951 Orthostatic hypotension: Secondary | ICD-10-CM

## 2014-06-26 DIAGNOSIS — E785 Hyperlipidemia, unspecified: Secondary | ICD-10-CM

## 2014-06-26 DIAGNOSIS — I426 Alcoholic cardiomyopathy: Secondary | ICD-10-CM

## 2014-06-26 MED ORDER — LOSARTAN POTASSIUM 100 MG PO TABS: 100.0000 mg | ORAL_TABLET | Freq: Every day | ORAL | Status: DC

## 2014-06-26 MED ORDER — CARVEDILOL 12.5 MG PO TABS: 12.5000 mg | ORAL_TABLET | Freq: Two times a day (BID) | ORAL | Status: DC

## 2014-06-26 NOTE — Progress Notes (Signed)
OFFICE NOTE  Chief Complaint:  Routine followup  Primary Care Physician: Merrilee Seashore, MD  HPI:  Bryan Pham  is an 80 year old gentleman with a history of coronary disease and a number of PCIs. Recently he had been having problems with orthostatic hypotension and his Lasix and atenolol were stopped and he has had marked improvement in those symptoms. He still gets a little dizzy when he gets up to go to urinate at night, probably because he springs out of bed. I told him it is important for him to sit up and then stand up and allow at least a minute or so equilibrate the blood pressures. There are probably still some signs of orthostasis. He was noted to have bradycardia recently was taken off of his atenolol. Currently is on no blood pressure medications. His blood pressure is appropriate today however is reported some days he does shoot up into the 098 systolic. Unfortunately, he recently was diagnosed with skin cancer. He's undergone multiple surgical procedures for this. It is unclear what the prognosis is.   Bryan Pham was referred back today for evaluation of an abnormal echocardiogram. He denies any symptoms such as shortness of breath but recently underwent an echocardiogram which demonstrated a new cardiomyopathy. His EF was 30-35% with global hypokinesis. Again he denies any chest pain or shortness of breath however has been and continues to use alcohol very heavily. In addition he has known coronary artery disease by cardiac catheterization in 2004. It is difficult to know whether he is new cardiomyopathy is due to heavy alcohol use or possibly multivessel coronary artery disease.  Bryan Pham returns today for followup. He reports that he has stopped drinking which I commended him on. It is not clear that he is taking his medications appropriately however. Blood pressure is markedly elevated today. Laboratory work shows significant chronic kidney disease which would  preclude cardiac catheterization. He is scheduled for followup with his nephrologist.   PMHx:  Past Medical History  Diagnosis Date  . Arthritis   . Diabetes mellitus   . Hyperlipidemia   . Hypertension   . Leg swelling     left leg   . Abdominal pain 10/12    pt was having abdominal pain over whole abdominal area   . CAD (coronary artery disease)     multiple PCI to diag system in 1990; echo 08/09/11- EF 40-45%, mild AR and mild MR; myoview 10/22/2009-EF 53%, defect in the inferior region, no ischemai or scar  . Orthostatic hypotension 07/2012    36mmHg drop in SBP, lasix and atenolol was D/C  . Kidney disease     mild    Past Surgical History  Procedure Laterality Date  . Back surgery  2009  . Cholecystectomy  09/07/11  . Cardiac catheterization  04/16/2003    EF >55%, significant HTN, mod disease at the distal portion of the LAD which is not amenable to PCI and a mod lesion in the prox portion of the OM 1, medical therapy  . Cardiac catheterization  06/11/1999    nl LV function, MVP, minor irregularities in the LAD and diag with no high grade stenosis  . Cardiac catheterization  06/14/1997    90% narrowing of the ostial portion of the first diag, no restenosis of prior PCI in LAD or OM 1, nl EF  . Coronary angioplasty  08/01/1996    2V CAD involving the distal LAD and OM1, angioplasty of distal LAD and the OM1  FAMHx:  Family History  Problem Relation Age of Onset  . Cancer Sister     colon    SOCHx:   reports that he has quit smoking. He has never used smokeless tobacco. He reports that he does not drink alcohol or use illicit drugs.  ALLERGIES:  Allergies  Allergen Reactions  . Benadryl [Diphenhydramine Hcl]     shaking    ROS: A comprehensive review of systems was negative except for: Constitutional: positive for weight loss  HOME MEDS: Current Outpatient Prescriptions  Medication Sig Dispense Refill  . aspirin 81 MG tablet Take 81 mg by mouth daily.       Marland Kitchen  losartan (COZAAR) 100 MG tablet Take 1 tablet (100 mg total) by mouth daily.  30 tablet  6  . carvedilol (COREG) 12.5 MG tablet Take 1 tablet (12.5 mg total) by mouth 2 (two) times daily with a meal.  60 tablet  6  . JANUVIA 50 MG tablet Take 1 tablet by mouth daily.      Marland Kitchen levothyroxine (SYNTHROID, LEVOTHROID) 25 MCG tablet Take 25 mcg by mouth daily.        Marland Kitchen omeprazole (PRILOSEC) 40 MG capsule Take 40 mg by mouth 2 (two) times daily.         No current facility-administered medications for this visit.    LABS/IMAGING: No results found for this or any previous visit (from the past 48 hour(s)). No results found.  VITALS: BP 193/92  Pulse 80  Ht 5\' 10"  (1.778 m)  Wt 154 lb 4.8 oz (69.99 kg)  BMI 22.14 kg/m2  EXAM: General appearance: alert and no distress Neck: no adenopathy, no carotid bruit, no JVD, supple, symmetrical, trachea midline and thyroid not enlarged, symmetric, no tenderness/mass/nodules Lungs: clear to auscultation bilaterally Heart: regular rate and rhythm, S1, S2 normal, 2/6 SEM at apex, no click, rub or gallop Abdomen: soft, non-tender; bowel sounds normal; no masses,  no organomegaly Extremities: 1+ edema in the left lower extremity Pulses: 2+ and symmetric Skin: multiple skin lesions, bandages from recent operations Neurologic: Grossly normal  EKG: deferred  ASSESSMENT: 1. 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 Coronary artery disease without angina 3. Orthostatic hypotension-resolved 4. Hyperlipidemia 5. CKD III 6. Skin cancer 7. Weight loss 8. HTN - uncontrolled  PLAN: 1.   Bryan Pham continues to have markedly elevated blood pressures. I recommended increasing his losartan today. He is agreeable to starting a beta blocker and I would start carvedilol 12.5 mg twice daily. He is fairly well compensated with his cardiomyopathy and I do not see indication for a diuretic at this time. I would like to have him followup with  Erasmo Downer in 2 weeks and me in 3 months. He will need to be on maximal medical therapy first before considering an ICD if he continues to be abstinent from alcohol and we do not demonstrate any improvement in his ejection fraction.  Should he need additional blood pressure control I would consider adding hydralazine to his regimen which would be beneficial for afterload reduction.  Pixie Casino, MD, [facility] Attending Cardiologist [facility] 06/26/2014, 5:18 PM

## 2014-06-26 NOTE — Patient Instructions (Addendum)
Your physician has recommended you make the following change in your medication.   1. Increase losartan to 100mg  daily. 2. Start carvedilol 12.5mg  once daily.   ** take all medications on the list below  Your physician recommends that you schedule a follow-up appointment in: 2 weeks with Erasmo Downer for BP check   Your physician recommends that you schedule a follow-up appointment in: 3 months with Dr. Debara Pickett

## 2014-06-27 NOTE — Telephone Encounter (Signed)
Rx refill sent to patient pharmacy   

## 2014-06-28 ENCOUNTER — Telehealth: Payer: Self-pay | Admitting: Internal Medicine

## 2014-06-28 NOTE — Telephone Encounter (Signed)
Per [facility] first Rx for Carvedilol was for #20, new Rx sent for #180.  They do not have a Rx for Losartan (sent to [facility] order).  Spoke with patient - he is somewhat confused and can not hear well on the phone.  Tried to verify meds but he could not understand.  I instructed him to read the AVS given to him at his last visit to make sure he is taking his meds correctly.  Patient assured me he will.

## 2014-06-28 NOTE — Telephone Encounter (Signed)
Pt was seen on Wednesday.  Two prescriptions were called in.  Pharmacy was only able to fill one (carvedilol). Patients states pharmacy told him "something was wrong" with the other one.[address]

## 2014-07-12 ENCOUNTER — Ambulatory Visit (INDEPENDENT_AMBULATORY_CARE_PROVIDER_SITE_OTHER): Payer: Commercial Managed Care - HMO | Admitting: Pharmacist Clinician (PhC)/ Clinical Pharmacy Specialist

## 2014-07-12 VITALS — BP 176/68 | HR 68 | Ht 70.0 in | Wt 157.3 lb

## 2014-07-12 DIAGNOSIS — I10 Essential (primary) hypertension: Secondary | ICD-10-CM

## 2014-07-12 NOTE — Patient Instructions (Signed)
Return for a a follow up appointment in 1 month  Your blood pressure today is 176/68  (goal <140/90)  Take your BP meds as follows:      MORNING - before breakfast - LEVOTHYROXINE     MORNING - with breakfast -   LOSARTAN, CARVEDILOL, JANUVIA, OMEPRAZOLW     SUPPERTIME - CARVEDILOL, ASPIRIN      Bring all of your meds to your next appointment.  Exercise as you're able, try to walk approximately 30 minutes per day.  Keep salt intake to a minimum, especially watch canned and prepared boxed foods.  Eat more fresh fruits and vegetables and fewer canned items.  Avoid eating in fast food restaurants.

## 2014-07-13 ENCOUNTER — Encounter: Payer: Self-pay | Admitting: Pharmacist Clinician (PhC)/ Clinical Pharmacy Specialist

## 2014-07-13 DIAGNOSIS — I10 Essential (primary) hypertension: Secondary | ICD-10-CM | POA: Insufficient documentation

## 2014-07-13 NOTE — Assessment & Plan Note (Addendum)
Today [name]' blood pressure remains elevated at 176/68.  When a standing pressure was taken, it dropped to 144/60.  Based on his diabetes, CKD and CrCl of 34, his BP goal should be <140/90.  However with his orthostatic drops, I would prefer to keep his pressure higher than that.  He states the dizziness upon standing is not so prevalent now, but still happens at times.  Today we mainly discussed how to become more compliant with his medications.  Today's BP reflects at least 24 hours without medication.  He has always been good about taking his levothyroxine before breakfast.   I stressed to him that because breakfast was a daily routine for him, he should continue with the levothyroxine before, but then take his losartan, Januvia, carvedilol and omeprazole with breakfast.  Then he should take the aspirin and second dose of carvedilol with his last meal, whether it be at 5pm, 9pm or anywhere in between.  This was written on his discharge instructions, as he states he won't recall what I've told him once he's left.   Although his pressure is elevated today, I am not going to make any changes, just encourage him to work on compliance.  I will check with Dr. Mathis Fare office next week, to find out what his pressure truly was in that office.  I will see him back in one month for follow up.

## 2014-07-13 NOTE — Progress Notes (Signed)
07/13/2014 [name] [birth year] [id number]   HPI:  Bryan Pham is a 80 y.o. male patient of Dr Debara Pickett, with a PMH below who presents today for a blood pressure check.  Bryan Pham has had a history of orthostatic hypotension, which was mostly resolved after stopping atenolol and furosemide.  Unfortunately he also has had pressures up into the 180s.  He saw Dr. Debara Pickett earlier this month, with a BP of 193/92.   Dr. Debara Pickett increased his losartan from 50 to 100 mg daily and added carvdiolol 12.5mg  twice daily.  Today he presents in the office, stating that he has not taken any medications today other than his levothyroxine.  When asked about his compliance and medicine habits, he states that he will take his levothyroxine each morning before breakfast.  Then he will take most of his medications somewhere around 2pm, although he "can't remember to take them every day".  For his second dose of carvedilol, it is often 9-11pm, if he remembers at all.  When asked about his mealtimes, he states that he eats breakfast regularly, but then lunch and dinner are "whenever he gets hungry, if he gets hungry."  He has no known family history of hypertension, his father died at age 70 of cancer and his mother at 41 of old age.  He believes that his children are healthy.  He does not smoke, having quit back in the mid 1970s and has been a heavy alcohol drinker, although he states it has been >6 weeks since his last drink.  He prepares his own meals, and admits to freely salting his food, as "otherwise it would taste bad".   He does no regular exercise.    He was seen by Dr. Ashby Dawes yesterday and states that his blood pressure was 96.  He can't recall if that was systolic or diastolic, he just remembers they told him that number.    Current Outpatient Prescriptions  Medication Sig Dispense Refill  . aspirin 81 MG tablet Take 81 mg by mouth daily.       . carvedilol (COREG) 12.5 MG tablet TAKE 1 TABLET BY  MOUTH TWICE DAILY WITH A MEAL  180 tablet  0  . JANUVIA 50 MG tablet Take 1 tablet by mouth daily.      Marland Kitchen levothyroxine (SYNTHROID, LEVOTHROID) 25 MCG tablet Take 25 mcg by mouth daily.        Marland Kitchen losartan (COZAAR) 100 MG tablet Take 1 tablet (100 mg total) by mouth daily.  30 tablet  6  . omeprazole (PRILOSEC) 40 MG capsule Take 40 mg by mouth 2 (two) times daily.         No current facility-administered medications for this visit.    Allergies  Allergen Reactions  . Benadryl [Diphenhydramine Hcl]     shaking    Past Medical History  Diagnosis Date  . Arthritis   . Diabetes mellitus   . Hyperlipidemia   . Hypertension   . Leg swelling     left leg   . Abdominal pain 10/12    pt was having abdominal pain over whole abdominal area   . CAD (coronary artery disease)     multiple PCI to diag system in 1990; echo 08/09/11- EF 40-45%, mild AR and mild MR; myoview 10/22/2009-EF 53%, defect in the inferior region, no ischemai or scar  . Orthostatic hypotension 07/2012    17mmHg drop in SBP, lasix and atenolol was D/C  . Kidney disease  mild    Blood pressure 176/68, pulse 68, height 5\' 10"  (1.778 m), weight 157 lb 4.8 oz (71.351 kg).  Standing 144/60   ASSESSMENT AND PLAN:  Bryan Pham PharmD CPP [facility]

## 2014-07-15 ENCOUNTER — Telehealth: Payer: Self-pay | Admitting: Internal Medicine

## 2014-07-15 NOTE — Telephone Encounter (Signed)
Returned call to patient, states has been unable to do much, too dizzy.   Admits he got confused about medications, so he took all of them at bedtime the last couple of nights.  He was confused when I mentioned specific medications, so I asked if he had the bottles in front of him.  Stated he had all 14 pill bottles.  We show he takes 4 prescriptions and 2 OTC.  Scheduled him to come this Wednesday at 1pm with ALL pill bottles and we will try to clean up and simplify his regimen.

## 2014-07-15 NOTE — Telephone Encounter (Signed)
Bryan Pham is calling because he not feeling and thinks its because his medication has been changed and would like to speak to someone .Marland Kitchen Please call    Thanks

## 2014-07-15 NOTE — Telephone Encounter (Signed)
Spoke with pt, he reports yesterday and today after taking his medicine he is having trouble with balance. He reports he is staggering around and when he got out of the car at the grocery store he could not find the buggy so he came home. He does not have a way of checking his bp at home. He can not tell if the dizziness is related to position change or not. Explained to pt his medicine was not changed in dosage but in the time to be taken. Patient voiced understanding of above and feels that is what is causing the problem. Will discuss with kristin alvstad pharm md.

## 2014-07-17 ENCOUNTER — Ambulatory Visit (INDEPENDENT_AMBULATORY_CARE_PROVIDER_SITE_OTHER): Payer: Commercial Managed Care - HMO | Admitting: Pharmacist Clinician (PhC)/ Clinical Pharmacy Specialist

## 2014-07-17 ENCOUNTER — Encounter: Payer: Self-pay | Admitting: Pharmacist Clinician (PhC)/ Clinical Pharmacy Specialist

## 2014-07-17 VITALS — BP 180/84

## 2014-07-17 DIAGNOSIS — I10 Essential (primary) hypertension: Secondary | ICD-10-CM

## 2014-07-17 NOTE — Progress Notes (Signed)
07/17/2014 Bryan Pham 21-Nov-1928 [id number]   HPI:  Bryan Pham is a 80 y.o. male patient of Dr Debara Pickett, with a PMH below who presents today for a medication review.   I saw him on Friday and spent most of the visit discussing the need to take his medications daily.  He then called on Monday, stating he spent the weekend feeling dizzy and unsteady, believing that his medications were the cause.  When I returned the call, I asked him to find his losartan bottle and tell me if he had indeed taken that medication.  He complained that he couldn't find losartan amongst the 14 prescription bottles on his table.  As we only had 6 meds on his list (including 81mg  ASA), I was concerned that he has multiple other medications for which we have no record.  On the phone he was rather upset and asked if he could just put everything in a bag and bring it in.  Today he comes in with a brown bag with multiple medication bottles.  He uses a Psych[facility], clinical and apparently they automatically refill prescriptions and ship to him.  The only medications we had no record of were calcitriol and sodium bicarbonate.  Unfortunately he has multiple bottles of several medications, including almost 6 months worth of losartan 50mg  as well as 3 months of losartan 100mg .  He states that he does take sodium bicarb twice daily, however the bottle is about half full and dated from April of this year.     When asked about family who would be able to help him out he states that his daughter had offered to come in with him, but he didn't want her to drive the 40 miles from her home.  He also has a son, who live in [place].   He feels better today, no dizziness.   Current Outpatient Prescriptions  Medication Sig Dispense Refill  . calcitRIOL (ROCALTROL) 0.5 MCG capsule Take 0.5 mcg by mouth daily.      Marland Kitchen aspirin 81 MG tablet Take 81 mg by mouth daily.       Marland Kitchen atorvastatin (LIPITOR) 40 MG tablet Take 40 mg by mouth daily.      .  carvedilol (COREG) 12.5 MG tablet TAKE 1 TABLET BY MOUTH TWICE DAILY WITH A MEAL  180 tablet  0  . JANUVIA 50 MG tablet Take 1 tablet by mouth daily.      Marland Kitchen levothyroxine (SYNTHROID, LEVOTHROID) 25 MCG tablet Take 25 mcg by mouth daily.        Marland Kitchen losartan (COZAAR) 100 MG tablet Take 1 tablet (100 mg total) by mouth daily.  30 tablet  6  . omeprazole (PRILOSEC) 40 MG capsule Take 40 mg by mouth 2 (two) times daily.         No current facility-administered medications for this visit.    Allergies  Allergen Reactions  . Benadryl [Diphenhydramine Hcl]     shaking    Past Medical History  Diagnosis Date  . Arthritis   . Diabetes mellitus   . Hyperlipidemia   . Hypertension   . Leg swelling     left leg   . Abdominal pain 10/12    pt was having abdominal pain over whole abdominal area   . CAD (coronary artery disease)     multiple PCI to diag system in 1990; echo 08/09/11- EF 40-45%, mild AR and mild MR; myoview 10/22/2009-EF 53%, defect in the inferior  region, no ischemai or scar  . Orthostatic hypotension 07/2012    87mmHg drop in SBP, lasix and atenolol was D/C  . Kidney disease     mild    Blood pressure 180/84.   ASSESSMENT AND PLAN:  I'm not sure if we are going to get him compliant with medications without some help in the home.  He readily admits he cannot remember to take medications other than levothyroxine, then will state that he takes all of his medications as directed.   He doesn't seem to have any type of home routines in regards to meals or sleep time.  His only steady routine seems to be that as soon as he gets out of bed he goes to the kitchen and takes his levothyroxine.  Then he may or may not eat breakfast, and doesn't seem to have any routines regarding other meals.  He doesn't like the idea of taking multiple medications.    Today I printed a list of all of his medications, numbering each medication and listing them by time of day to be taken.  I also used a sharpie  pen and numbered each bottle to correspond to the list.   I printed the list in large type for him to see clearly.  We reviewed the list and numbered bottles several times.  I will see him back in 2 weeks and I encouraged him to bring his daughter with him to that appointment.     [name] PharmD CPP [facility]

## 2014-07-17 NOTE — Patient Instructions (Signed)
Before Breakfast  1.  Levothyroxine 7mcg With Breakfast  2.  Carvedilol 12.5 mg  3.  Calcitriol 0.5mg   4.  omeprazole 40mg   5.  Losartan 50mg  - 2 TABLETS  6.  Aspirin 81mg   7.  Januvia 50mg   9.  Sodium bicarbinate At Suppertime or Bedtime  2.  Carvedilol 12.5mg   8.  atorvastatin 40mg   9.  Sodium bicarbonate

## 2014-08-02 ENCOUNTER — Ambulatory Visit (INDEPENDENT_AMBULATORY_CARE_PROVIDER_SITE_OTHER): Payer: Commercial Managed Care - HMO | Admitting: Pharmacist Clinician (PhC)/ Clinical Pharmacy Specialist

## 2014-08-02 VITALS — BP 220/90 | Ht 70.0 in | Wt 162.0 lb

## 2014-08-02 DIAGNOSIS — I10 Essential (primary) hypertension: Secondary | ICD-10-CM

## 2014-08-02 NOTE — Patient Instructions (Signed)
Return for a a follow up appointment Friday October 2 at 2:40pm  Your blood pressure today is 220/90  Before Breakfast  1. Levothyroxine 100mcg  With Breakfast  2. Carvedilol 12.5 mg  3. Calcitriol 0.5mg   4. omeprazole 40mg   5. Losartan 50mg  - 2 TABLETS  6. Aspirin 81mg   7. Januvia 50mg   9. Sodium bicarbinate  **New medication** furosemide 40mg   At Suppertime or Bedtime  2. Carvedilol 12.5mg   8. atorvastatin 40mg   9. Sodium bicarbonate    Bring all of your meds, your BP cuff and your record of home blood pressures to your next appointment.  Exercise as you're able, try to walk approximately 30 minutes per day.  Keep salt intake to a minimum, especially watch canned and prepared boxed foods.  Eat more fresh fruits and vegetables and fewer canned items.  Avoid eating in fast food restaurants.

## 2014-08-05 ENCOUNTER — Encounter: Payer: Self-pay | Admitting: Pharmacist Clinician (PhC)/ Clinical Pharmacy Specialist

## 2014-08-05 NOTE — Progress Notes (Signed)
08/05/2014 Bryan Pham Oct 23, 1929 [id number]   HPI:  Bryan Pham is a 80 y.o. male patient of Dr Debara Pickett, with a PMH below who presents today for a blood pressure check.   Bryan Pham has been a challenging patient, as he has memory problems, yet lives alone with no nearby family to give support.  I have met with him on 2 previous occasions and am still trying to find a way to increase his compliance.  At his last visit, we numbered all of his prescription bottles and gave him a written numbered list correlating which pills to take at what times.  Today he states that this has helped him quite a bit and denies missed doses.  I believe that when he remembers to take meds, he follows the chart and takes all of the required meds, the bigger problem is getting him to remember it's time to take meds.  He states he hasn't taken meds yet today, he'll do it "later".   Strangely enough, he seems to never forget his levothyroxine every morning, as soon as he gets out of bed.  Otherwise, he has no consistency to when he eats meals or carries out any daily activities.    He saw Dr. Jimmy Pham on Thursday and was given an prescription for furosemide 53m, which he still has in his pocket.  He states he's been having trouble with swelling in his ankles and legs, but until he saw Dr. DJimmy Pham nobody has done anything to help him.  I pointed out that he was previously on furosemide, but it was discontinued due to dizziness.  He doesn't recall this.     Current Outpatient Prescriptions  Medication Sig Dispense Refill  . aspirin 81 MG tablet Take 81 mg by mouth daily.       .Marland Kitchenatorvastatin (LIPITOR) 40 MG tablet Take 40 mg by mouth daily.      . calcitRIOL (ROCALTROL) 0.5 MCG capsule Take 0.5 mcg by mouth daily.      . carvedilol (COREG) 12.5 MG tablet TAKE 1 TABLET BY MOUTH TWICE DAILY WITH A MEAL  180 tablet  0  . JANUVIA 50 MG tablet Take 1 tablet by mouth daily.      .Marland Kitchenlevothyroxine (SYNTHROID,  LEVOTHROID) 25 MCG tablet Take 25 mcg by mouth daily.        .Marland Kitchenlosartan (COZAAR) 100 MG tablet Take 1 tablet (100 mg total) by mouth daily.  30 tablet  6  . omeprazole (PRILOSEC) 40 MG capsule Take 40 mg by mouth 2 (two) times daily.        . sodium bicarbonate 325 MG tablet Take 650 mg by mouth 2 (two) times daily.       No current facility-administered medications for this visit.    Allergies  Allergen Reactions  . Benadryl [Diphenhydramine Hcl]     shaking    Past Medical History  Diagnosis Date  . Arthritis   . Diabetes mellitus   . Hyperlipidemia   . Hypertension   . Leg swelling     left leg   . Abdominal pain 10/12    pt was having abdominal pain over whole abdominal area   . CAD (coronary artery disease)     multiple PCI to diag system in 1990; echo 08/09/11- EF 40-45%, mild AR and mild Bryan; myoview 10/22/2009-EF 53%, defect in the inferior region, no ischemai or scar  . Orthostatic hypotension 07/2012    243mg drop in SBP,  lasix and atenolol was D/C  . Kidney disease     mild    Blood pressure 220/90, height '5\' 10"'  (1.778 m), weight 162 lb (73.483 kg).  Standing 200/78   ASSESSMENT AND PLAN:  Hi is a challenge to dose.  I have stressed that he go home and take his meds right away.  He has had problems with orthostatic hypotension in the past, so I am always wary of adding more medications, despite the reading today.  I'm afraid that if we give him some clonidine in the office, he will either go home, take his regular meds and become hypotensive, or not take anything, because we gave him meds in the office.  Also he is driving himself and I worry about any drowsiness the medication might cause him.  He states his home cuff gives good readings, so I have asked him to return in 2 weeks and bring that cuff with him.  I also made his appointment for later in the day and stressed both verbally and in writing, the need to take all morning meds prior to that appointment.  Bryan Pham PharmD CPP [facility]

## 2014-08-08 ENCOUNTER — Emergency Department (HOSPITAL_COMMUNITY): Payer: Medicare HMO

## 2014-08-08 ENCOUNTER — Inpatient Hospital Stay (HOSPITAL_COMMUNITY)
Admission: EM | Admit: 2014-08-08 | Discharge: 2014-08-10 | DRG: 312 | Disposition: A | Discharge status: Home / Self Care | Payer: Medicare HMO | Attending: Internal Medicine | Admitting: Internal Medicine

## 2014-08-08 ENCOUNTER — Encounter (HOSPITAL_COMMUNITY): Payer: Self-pay | Admitting: Emergency Medicine

## 2014-08-08 DIAGNOSIS — N183 Chronic kidney disease, stage 3 unspecified: Secondary | ICD-10-CM | POA: Diagnosis present

## 2014-08-08 DIAGNOSIS — R55 Syncope and collapse: Secondary | ICD-10-CM | POA: Diagnosis not present

## 2014-08-08 DIAGNOSIS — Z9861 Coronary angioplasty status: Secondary | ICD-10-CM

## 2014-08-08 DIAGNOSIS — D696 Thrombocytopenia, unspecified: Secondary | ICD-10-CM | POA: Diagnosis present

## 2014-08-08 DIAGNOSIS — M129 Arthropathy, unspecified: Secondary | ICD-10-CM | POA: Diagnosis present

## 2014-08-08 DIAGNOSIS — R413 Other amnesia: Secondary | ICD-10-CM | POA: Diagnosis present

## 2014-08-08 DIAGNOSIS — E119 Type 2 diabetes mellitus without complications: Secondary | ICD-10-CM | POA: Diagnosis present

## 2014-08-08 DIAGNOSIS — I426 Alcoholic cardiomyopathy: Secondary | ICD-10-CM | POA: Diagnosis present

## 2014-08-08 DIAGNOSIS — Z87891 Personal history of nicotine dependence: Secondary | ICD-10-CM

## 2014-08-08 DIAGNOSIS — Z91199 Patient's noncompliance with other medical treatment and regimen due to unspecified reason: Secondary | ICD-10-CM

## 2014-08-08 DIAGNOSIS — I951 Orthostatic hypotension: Secondary | ICD-10-CM | POA: Diagnosis not present

## 2014-08-08 DIAGNOSIS — I129 Hypertensive chronic kidney disease with stage 1 through stage 4 chronic kidney disease, or unspecified chronic kidney disease: Secondary | ICD-10-CM | POA: Diagnosis present

## 2014-08-08 DIAGNOSIS — N179 Acute kidney failure, unspecified: Secondary | ICD-10-CM | POA: Diagnosis present

## 2014-08-08 DIAGNOSIS — I2510 Atherosclerotic heart disease of native coronary artery without angina pectoris: Secondary | ICD-10-CM | POA: Diagnosis present

## 2014-08-08 DIAGNOSIS — Z9119 Patient's noncompliance with other medical treatment and regimen: Secondary | ICD-10-CM

## 2014-08-08 DIAGNOSIS — I10 Essential (primary) hypertension: Secondary | ICD-10-CM | POA: Diagnosis present

## 2014-08-08 DIAGNOSIS — E876 Hypokalemia: Secondary | ICD-10-CM | POA: Diagnosis present

## 2014-08-08 DIAGNOSIS — Z7982 Long term (current) use of aspirin: Secondary | ICD-10-CM

## 2014-08-08 DIAGNOSIS — E785 Hyperlipidemia, unspecified: Secondary | ICD-10-CM | POA: Diagnosis present

## 2014-08-08 DIAGNOSIS — Z888 Allergy status to other drugs, medicaments and biological substances status: Secondary | ICD-10-CM

## 2014-08-08 HISTORY — DX: Gastro-esophageal reflux disease without esophagitis: K21.9

## 2014-08-08 HISTORY — DX: Family history of other specified conditions: Z84.89

## 2014-08-08 LAB — URINALYSIS, ROUTINE W REFLEX MICROSCOPIC
Bilirubin Urine: NEGATIVE
Glucose, UA: NEGATIVE mg/dL
HGB URINE DIPSTICK: NEGATIVE
Ketones, ur: NEGATIVE mg/dL
Leukocytes, UA: NEGATIVE
Nitrite: NEGATIVE
Protein, ur: NEGATIVE mg/dL
SPECIFIC GRAVITY, URINE: 1.008 (ref 1.005–1.030)
UROBILINOGEN UA: 1 mg/dL (ref 0.0–1.0)
pH: 7.5 (ref 5.0–8.0)

## 2014-08-08 LAB — COMPREHENSIVE METABOLIC PANEL
ALBUMIN: 4.1 g/dL (ref 3.5–5.2)
ALK PHOS: 80 U/L (ref 39–117)
ALT: 27 U/L (ref 0–53)
ANION GAP: 19 — AB (ref 5–15)
AST: 52 U/L — ABNORMAL HIGH (ref 0–37)
BUN: 28 mg/dL — ABNORMAL HIGH (ref 6–23)
CALCIUM: 9.4 mg/dL (ref 8.4–10.5)
CO2: 29 mEq/L (ref 19–32)
Chloride: 95 mEq/L — ABNORMAL LOW (ref 96–112)
Creatinine, Ser: 2.34 mg/dL — ABNORMAL HIGH (ref 0.50–1.35)
GFR calc Af Amer: 28 mL/min — ABNORMAL LOW (ref >90)
GFR calc non Af Amer: 24 mL/min — ABNORMAL LOW (ref >90)
Glucose, Bld: 119 mg/dL — ABNORMAL HIGH (ref 70–99)
Potassium: 3.2 mEq/L — ABNORMAL LOW (ref 3.7–5.3)
SODIUM: 143 meq/L (ref 137–147)
TOTAL PROTEIN: 6.6 g/dL (ref 6.0–8.3)
Total Bilirubin: 1.2 mg/dL (ref 0.3–1.2)

## 2014-08-08 LAB — CBC WITH DIFFERENTIAL/PLATELET
BASOS PCT: 1 % (ref 0–1)
Basophils Absolute: 0 10*3/uL (ref 0.0–0.1)
EOS ABS: 0.5 10*3/uL (ref 0.0–0.7)
Eosinophils Relative: 11 % — ABNORMAL HIGH (ref 0–5)
HCT: 32.3 % — ABNORMAL LOW (ref 39.0–52.0)
Hemoglobin: 11.2 g/dL — ABNORMAL LOW (ref 13.0–17.0)
Lymphocytes Relative: 14 % (ref 12–46)
Lymphs Abs: 0.7 10*3/uL (ref 0.7–4.0)
MCH: 33.5 pg (ref 26.0–34.0)
MCHC: 34.7 g/dL (ref 30.0–36.0)
MCV: 96.7 fL (ref 78.0–100.0)
Monocytes Absolute: 0.3 10*3/uL (ref 0.1–1.0)
Monocytes Relative: 6 % (ref 3–12)
NEUTROS PCT: 68 % (ref 43–77)
Neutro Abs: 3.2 10*3/uL (ref 1.7–7.7)
PLATELETS: 72 10*3/uL — AB (ref 150–400)
RBC: 3.34 MIL/uL — ABNORMAL LOW (ref 4.22–5.81)
RDW: 12.9 % (ref 11.5–15.5)
WBC: 4.8 10*3/uL (ref 4.0–10.5)

## 2014-08-08 LAB — GLUCOSE, CAPILLARY: GLUCOSE-CAPILLARY: 116 mg/dL — AB (ref 70–99)

## 2014-08-08 LAB — TROPONIN I: Troponin I: <0.3 ng/mL (ref <0.30)

## 2014-08-08 LAB — MAGNESIUM: Magnesium: 1.5 mg/dL (ref 1.5–2.5)

## 2014-08-08 MED ORDER — SODIUM CHLORIDE 0.9 % IV SOLN
INTRAVENOUS | Status: AC
  Administered 2014-08-08 – 2014-08-09 (×2): via INTRAVENOUS

## 2014-08-08 MED ORDER — INSULIN ASPART 100 UNIT/ML ~~LOC~~ SOLN
0.0000 [IU] | Freq: Three times a day (TID) | SUBCUTANEOUS | Status: DC
  Administered 2014-08-09 – 2014-08-10 (×3): 1 [IU] via SUBCUTANEOUS

## 2014-08-08 MED ORDER — DOCUSATE SODIUM 100 MG PO CAPS
100.0000 mg | ORAL_CAPSULE | Freq: Two times a day (BID) | ORAL | Status: DC
  Administered 2014-08-08 – 2014-08-10 (×4): 100 mg via ORAL
  Filled 2014-08-08 (×7): qty 1

## 2014-08-08 MED ORDER — CALCITRIOL 0.5 MCG PO CAPS
0.5000 ug | ORAL_CAPSULE | Freq: Every day | ORAL | Status: DC
  Administered 2014-08-08 – 2014-08-10 (×3): 0.5 ug via ORAL
  Filled 2014-08-08 (×3): qty 1

## 2014-08-08 MED ORDER — CARVEDILOL 12.5 MG PO TABS
12.5000 mg | ORAL_TABLET | Freq: Two times a day (BID) | ORAL | Status: DC
  Administered 2014-08-08 – 2014-08-10 (×4): 12.5 mg via ORAL
  Filled 2014-08-08 (×6): qty 1

## 2014-08-08 MED ORDER — MAGNESIUM HYDROXIDE 400 MG/5ML PO SUSP: 30.0000 mL | Freq: Every day | ORAL | Status: DC | PRN

## 2014-08-08 MED ORDER — SODIUM CHLORIDE 0.9 % IV BOLUS (SEPSIS)
250.0000 mL | Freq: Once | INTRAVENOUS | Status: AC
  Administered 2014-08-08: 250 mL via INTRAVENOUS

## 2014-08-08 MED ORDER — SODIUM CHLORIDE 0.9 % IJ SOLN: 3.0000 mL | Freq: Two times a day (BID) | INTRAMUSCULAR | Status: DC

## 2014-08-08 MED ORDER — ATORVASTATIN CALCIUM 40 MG PO TABS
40.0000 mg | ORAL_TABLET | Freq: Every day | ORAL | Status: DC
  Administered 2014-08-08 – 2014-08-09 (×2): 40 mg via ORAL
  Filled 2014-08-08 (×3): qty 1

## 2014-08-08 MED ORDER — PANTOPRAZOLE SODIUM 40 MG PO TBEC
40.0000 mg | DELAYED_RELEASE_TABLET | Freq: Every day | ORAL | Status: DC
  Administered 2014-08-09 – 2014-08-10 (×2): 40 mg via ORAL
  Filled 2014-08-08 (×2): qty 1

## 2014-08-08 MED ORDER — ONDANSETRON HCL 4 MG PO TABS: 4.0000 mg | ORAL_TABLET | Freq: Four times a day (QID) | ORAL | Status: DC | PRN

## 2014-08-08 MED ORDER — POTASSIUM CHLORIDE CRYS ER 20 MEQ PO TBCR
40.0000 meq | EXTENDED_RELEASE_TABLET | Freq: Once | ORAL | Status: AC
  Administered 2014-08-08: 40 meq via ORAL
  Filled 2014-08-08: qty 2

## 2014-08-08 MED ORDER — ACETAMINOPHEN 650 MG RE SUPP: 650.0000 mg | Freq: Four times a day (QID) | RECTAL | Status: DC | PRN

## 2014-08-08 MED ORDER — LEVOTHYROXINE SODIUM 25 MCG PO TABS
25.0000 ug | ORAL_TABLET | Freq: Every day | ORAL | Status: DC
  Administered 2014-08-09 – 2014-08-10 (×2): 25 ug via ORAL
  Filled 2014-08-08 (×4): qty 1

## 2014-08-08 MED ORDER — HYDROCODONE-ACETAMINOPHEN 5-325 MG PO TABS: 1.0000 | ORAL_TABLET | Freq: Three times a day (TID) | ORAL | Status: DC | PRN

## 2014-08-08 MED ORDER — SODIUM BICARBONATE 650 MG PO TABS
650.0000 mg | ORAL_TABLET | Freq: Two times a day (BID) | ORAL | Status: DC
  Administered 2014-08-08: 650 mg via ORAL
  Filled 2014-08-08 (×4): qty 1

## 2014-08-08 MED ORDER — ACETAMINOPHEN 325 MG PO TABS: 650.0000 mg | ORAL_TABLET | Freq: Four times a day (QID) | ORAL | Status: DC | PRN

## 2014-08-08 MED ORDER — ONDANSETRON HCL 4 MG/2ML IJ SOLN
4.0000 mg | Freq: Four times a day (QID) | INTRAMUSCULAR | Status: DC | PRN
Start: 2014-08-08 — End: 2014-08-10

## 2014-08-08 NOTE — H&P (Addendum)
[facility] History and Physical  JERUSALEM WERT MEQ:[id number] DOB: May 05, 1929 DOA: 08/08/2014  Referring physician: EDP PCP: Merrilee Seashore, MD   Chief Complaint: passed out  HPI: Bryan Pham is a 80 y.o. male  With h/o hypertension orthostatic hypotension, CKD, peripheral edema, cardiomyopathy with EF 30-35% diabetes, presents after having 2 syncopal episodes at [facility]. Per review of outpatient records, recently started on lasix for peripheral edema, concerns about medication compliance, confusion about medications.  In ED, creatinine 2.34 (usually ranges 1.6 -2.0), potassium 3.2, CT brain and C spine shows nothing acute, except scalp hematoma, negative UA and CXR. Orthostatics: supine 171/64, standing 68/52.    Review of Systems:  Systems reviewed. As above, otherwise negative.  Past Medical History  Diagnosis Date  . Arthritis   . Diabetes mellitus   . Hyperlipidemia   . Hypertension   . Leg swelling     left leg   . Abdominal pain 10/12    pt was having abdominal pain over whole abdominal area   . CAD (coronary artery disease)     multiple PCI to diag system in 1990; echo 08/09/11- EF 40-45%, mild AR and mild MR; myoview 10/22/2009-EF 53%, defect in the inferior region, no ischemai or scar  . Orthostatic hypotension 07/2012    15mmHg drop in SBP, lasix and atenolol was D/C  . Kidney disease     mild   Past Surgical History  Procedure Laterality Date  . Back surgery  2009  . Cholecystectomy  09/07/11  . Cardiac catheterization  04/16/2003    EF >55%, significant HTN, mod disease at the distal portion of the LAD which is not amenable to PCI and a mod lesion in the prox portion of the OM 1, medical therapy  . Cardiac catheterization  06/11/1999    nl LV function, MVP, minor irregularities in the LAD and diag with no high grade stenosis  . Cardiac catheterization  06/14/1997    90% narrowing of the ostial portion of the first diag, no restenosis of prior PCI in LAD  or OM 1, nl EF  . Coronary angioplasty  08/01/1996    2V CAD involving the distal LAD and OM1, angioplasty of distal LAD and the OM1   Social History:  reports that he quit smoking about 40 years ago. He has never used smokeless tobacco. He reports that he does not drink alcohol or use illicit drugs. lives alone  Allergies  Allergen Reactions  . Benadryl [Diphenhydramine Hcl]     shaking    Family History  Problem Relation Age of Onset  . Cancer Sister     colon     Prior to Admission medications   Medication Sig Start Date End Date Taking? Authorizing Provider  aspirin 81 MG tablet Take 81 mg by mouth daily.    Yes Historical Provider, MD  atorvastatin (LIPITOR) 40 MG tablet Take 40 mg by mouth daily. 07/04/14  Yes Historical Provider, MD  calcitRIOL (ROCALTROL) 0.5 MCG capsule Take 0.5 mcg by mouth daily.   Yes Historical Provider, MD  carvedilol (COREG) 12.5 MG tablet Take 12.5 mg by mouth 2 (two) times daily with a meal.   Yes Historical Provider, MD  furosemide (LASIX) 40 MG tablet Take 40 mg by mouth daily. 08/02/14  Yes Historical Provider, MD  HYDROcodone-acetaminophen (NORCO/VICODIN) 5-325 MG per tablet Take 1 tablet by mouth 3 (three) times daily as needed (for pain).  07/18/14  Yes Historical Provider, MD  JANUVIA 50 MG tablet Take 1  tablet by mouth daily. 06/24/14  Yes Historical Provider, MD  levothyroxine (SYNTHROID, LEVOTHROID) 25 MCG tablet Take 25 mcg by mouth daily.     Yes Historical Provider, MD  losartan (COZAAR) 100 MG tablet Take 1 tablet (100 mg total) by mouth daily. 06/26/14  Yes Pixie Casino, MD  omeprazole (PRILOSEC) 40 MG capsule Take 40 mg by mouth 2 (two) times daily.     Yes Historical Provider, MD  sodium bicarbonate 325 MG tablet Take 650 mg by mouth 2 (two) times daily.   Yes Historical Provider, MD   Physical Exam: Filed Vitals:   08/08/14 1415 08/08/14 1445 08/08/14 1530 08/08/14 1600  BP:   171/64   Pulse: 71 76 65 67  Temp:      TempSrc:        Resp: 15 17 14 15   Height:      Weight:      SpO2: 97% 100% 100% 100%    Wt Readings from Last 3 Encounters:  08/08/14 70.308 kg (155 lb)  08/05/14 73.483 kg (162 lb)  07/13/14 71.351 kg (157 lb 4.8 oz)    BP 162/70  Pulse 80  Temp(Src) 97.7 F (36.5 C) (Oral)  Resp 18  Ht 5\' 10"  (1.778 m)  Wt 63.5 kg (139 lb 15.9 oz)  BMI 20.09 kg/m2  SpO2 100%  General Appearance:    Alert, cooperative, oriented, but forgetful  Head:    Left frontal scalp hematoma  Eyes:    PERRL, conjunctiva/corneas clear, EOM's intact,      Nose:   Nares normal, septum midline, mucosa normal, no drainage   or sinus tenderness  Throat:   Lips, mucosa, and tongue normal; teeth and gums normal  Neck:   Supple, symmetrical, trachea midline, no adenopathy;       thyroid:  No enlargement/tenderness/nodules; no carotid   bruit or JVD  Back:     Symmetric, no curvature, ROM normal, no CVA tenderness  Lungs:     Clear to auscultation bilaterally, respirations unlabored  Chest wall:    No tenderness or deformity  Heart:    Regular rate and rhythm, S1 and S2 normal, no murmur, rub   or gallop  Abdomen:     Soft, non-tender, bowel sounds active all four quadrants,    no masses, no organomegaly  Genitalia:    deferred  Rectal:    deferred  Extremities:   Extremities normal, atraumatic, no cyanosis or edema  Pulses:   2+ and symmetric all extremities  Skin:   Skin color, texture, turgor normal, no rashes or lesions  Lymph nodes:   Cervical, supraclavicular, and axillary nodes normal  Neurologic:   CNII-XII intact. Normal strength, sensation and reflexes      throughout             Psych: normal affect  Labs on Admission:  Basic Metabolic Panel:  Recent Labs Lab 08/08/14 1242  NA 143  K 3.2*  CL 95*  CO2 29  GLUCOSE 119*  BUN 28*  CREATININE 2.34*  CALCIUM 9.4   Liver Function Tests:  Recent Labs Lab 08/08/14 1242  AST 52*  ALT 27  ALKPHOS 80  BILITOT 1.2  PROT 6.6  ALBUMIN 4.1    No results found for this basename: LIPASE, AMYLASE,  in the last 168 hours No results found for this basename: AMMONIA,  in the last 168 hours CBC:  Recent Labs Lab 08/08/14 1242  WBC 4.8  NEUTROABS 3.2  HGB 11.2*  HCT 32.3*  MCV 96.7  PLT 72*   Cardiac Enzymes:  Recent Labs Lab 08/08/14 1242  TROPONINI <0.30    BNP (last 3 results) No results found for this basename: PROBNP,  in the last 8760 hours CBG: No results found for this basename: GLUCAP,  in the last 168 hours  Radiological Exams on Admission: Dg Chest 2 View  08/08/2014   CLINICAL DATA:  Status post fall.  EXAM: CHEST  2 VIEW  COMPARISON:  PA and lateral chest 08/06/2011.  FINDINGS: Heart size and mediastinal contours are within normal limits. Both lungs are clear. Visualized skeletal structures are unremarkable.  IMPRESSION: Negative exam.   Electronically Signed   By: Inge Rise M.D.   On: 08/08/2014 13:26   Ct Head Wo Contrast  08/08/2014   CLINICAL DATA:  Syncope and head injury.  EXAM: CT HEAD WITHOUT CONTRAST  CT CERVICAL SPINE WITHOUT CONTRAST  TECHNIQUE: Multidetector CT imaging of the head and cervical spine was performed following the standard protocol without intravenous contrast. Multiplanar CT image reconstructions of the cervical spine were also generated.  COMPARISON:  08/10/2011  FINDINGS: CT HEAD FINDINGS  There is mild central and cortical atrophy. Periventricular white matter changes are consistent with small vessel disease and appear stable. There is no evidence for hemorrhage, mass lesion, or acute infarction. There is left frontal scalp edema/hematoma without underlying calvarial fracture. There is atherosclerotic calcification of the internal carotid arteries.  CT CERVICAL SPINE FINDINGS  There are significant degenerative changes throughout the cervical spine but most notably at C5-6 and C6-7. There is normal alignment of the cervical spine. There is no evidence for acute fracture or  dislocation. Prevertebral soft tissues have a normal appearance. Lung apices have a normal appearance.  IMPRESSION: 1. Atrophy and small vessel disease. 2.  No evidence for acute intracranial abnormality. 3. Left frontal scalp edema/hematoma without underlying calvarial fracture. 4. Degenerative changes in the cervical spine. 5.  No evidence for acute cervical spine abnormality.   Electronically Signed   By: Shon Hale M.D.   On: 08/08/2014 13:32   Ct Cervical Spine Wo Contrast  08/08/2014   CLINICAL DATA:  Syncope and head injury.  EXAM: CT HEAD WITHOUT CONTRAST  CT CERVICAL SPINE WITHOUT CONTRAST  TECHNIQUE: Multidetector CT imaging of the head and cervical spine was performed following the standard protocol without intravenous contrast. Multiplanar CT image reconstructions of the cervical spine were also generated.  COMPARISON:  08/10/2011  FINDINGS: CT HEAD FINDINGS  There is mild central and cortical atrophy. Periventricular white matter changes are consistent with small vessel disease and appear stable. There is no evidence for hemorrhage, mass lesion, or acute infarction. There is left frontal scalp edema/hematoma without underlying calvarial fracture. There is atherosclerotic calcification of the internal carotid arteries.  CT CERVICAL SPINE FINDINGS  There are significant degenerative changes throughout the cervical spine but most notably at C5-6 and C6-7. There is normal alignment of the cervical spine. There is no evidence for acute fracture or dislocation. Prevertebral soft tissues have a normal appearance. Lung apices have a normal appearance.  IMPRESSION: 1. Atrophy and small vessel disease. 2.  No evidence for acute intracranial abnormality. 3. Left frontal scalp edema/hematoma without underlying calvarial fracture. 4. Degenerative changes in the cervical spine. 5.  No evidence for acute cervical spine abnormality.   Electronically Signed   By: Shon Hale M.D.   On: 08/08/2014 13:32    EKG:  NSR, LBBB  Assessment/Plan Principal Problem:  Syncope secondary to orthostatic hypotension: d/c lasix. Hold cozaar. Compression stockings. Observation. PT eval. Gentle hydration. Active Problems:   Orthostatic hypotension, chronic. Worsened by recent addition of lasix to regimen.   CAD (coronary artery disease)   Hyperlipidemia   CKD (chronic kidney disease), stage III: creat up. Likely volume depleted   Thrombocytopenia, unspecified: chronic   Essential hypertension Impaired memory: ?dementia? Had low B12 previously per review of labs. Will recheck, also folate, TSH. DM: per notes, not taking [place]. Monitor and check hgb a1C Cardiomyopathy: no CHF. Monitor for fluid overload  Code Status: full DVT Prophylaxis: scd Family Communication: none available Disposition Plan: obs  Time spent: 60 min  [facility] Pager [phone]

## 2014-08-08 NOTE — ED Provider Notes (Signed)
CSN: [id number]     Arrival date & time 08/08/14  1232 History   First MD Initiated Contact with Patient 08/08/14 1233     Chief Complaint  Patient presents with  . Loss of Consciousness     (Consider location/radiation/quality/duration/timing/severity/associated sxs/prior Treatment) HPI Comments: Was brought to the for evaluation of syncope. Patient reports that he was at [facility] when he started to feel very weak. [employer] workers report that the patient fell. Patient recalls falling backwards, hitting the back of his head on the ground. He is not sure if he was knocked out. EMS was called, by the time they arrived he was sitting in a wheelchair. Therefore the patient was slow to respond to them. He did have mild hypotension. They were loading him onto the stretcher he had a syncopal episode, complete loss of consciousness for a brief period of time. An IV was established and he was given a small amount of fluid with improvement. Upon arrival to the ER, patient complains of pain in the back of his head. He reports "I still don't feel right".  She denies chest pain, shortness of breath. He has not had any vomiting or diarrhea. Patient reports his blood pressure has been very high recently, just had a new blood pressure pill started in the last couple of days. Not sure what medication it is.  Patient is a 80 y.o. male presenting with syncope.  Loss of Consciousness Associated symptoms: headaches   Associated symptoms: no chest pain and no shortness of breath     Past Medical History  Diagnosis Date  . Arthritis   . Diabetes mellitus   . Hyperlipidemia   . Hypertension   . Leg swelling     left leg   . Abdominal pain 10/12    pt was having abdominal pain over whole abdominal area   . CAD (coronary artery disease)     multiple PCI to diag system in 1990; echo 08/09/11- EF 40-45%, mild AR and mild MR; myoview 10/22/2009-EF 53%, defect in the inferior region, no ischemai or scar  .  Orthostatic hypotension 07/2012    20mmHg drop in SBP, lasix and atenolol was D/C  . Kidney disease     mild   Past Surgical History  Procedure Laterality Date  . Back surgery  2009  . Cholecystectomy  09/07/11  . Cardiac catheterization  04/16/2003    EF >55%, significant HTN, mod disease at the distal portion of the LAD which is not amenable to PCI and a mod lesion in the prox portion of the OM 1, medical therapy  . Cardiac catheterization  06/11/1999    nl LV function, MVP, minor irregularities in the LAD and diag with no high grade stenosis  . Cardiac catheterization  06/14/1997    90% narrowing of the ostial portion of the first diag, no restenosis of prior PCI in LAD or OM 1, nl EF  . Coronary angioplasty  08/01/1996    2V CAD involving the distal LAD and OM1, angioplasty of distal LAD and the OM1   Family History  Problem Relation Age of Onset  . Cancer Sister     colon   History  Substance Use Topics  . Smoking status: Former Smoker    Quit date: 11/15/1973  . Smokeless tobacco: Never Used  . Alcohol Use: No    Review of Systems  Respiratory: Negative for shortness of breath.   Cardiovascular: Positive for syncope. Negative for chest pain.  Neurological: Positive  for syncope and headaches.  All other systems reviewed and are negative.     Allergies  Benadryl  Home Medications   Prior to Admission medications   Medication Sig Start Date End Date Taking? Authorizing Provider  aspirin 81 MG tablet Take 81 mg by mouth daily.    Yes Historical Provider, MD  atorvastatin (LIPITOR) 40 MG tablet Take 40 mg by mouth daily. 07/04/14  Yes Historical Provider, MD  calcitRIOL (ROCALTROL) 0.5 MCG capsule Take 0.5 mcg by mouth daily.   Yes Historical Provider, MD  carvedilol (COREG) 12.5 MG tablet Take 12.5 mg by mouth 2 (two) times daily with a meal.   Yes Historical Provider, MD  furosemide (LASIX) 40 MG tablet Take 40 mg by mouth daily. 08/02/14  Yes Historical Provider, MD    HYDROcodone-acetaminophen (NORCO/VICODIN) 5-325 MG per tablet Take 1 tablet by mouth 3 (three) times daily as needed (for pain).  07/18/14  Yes Historical Provider, MD  JANUVIA 50 MG tablet Take 1 tablet by mouth daily. 06/24/14  Yes Historical Provider, MD  levothyroxine (SYNTHROID, LEVOTHROID) 25 MCG tablet Take 25 mcg by mouth daily.     Yes Historical Provider, MD  losartan (COZAAR) 100 MG tablet Take 1 tablet (100 mg total) by mouth daily. 06/26/14  Yes Pixie Casino, MD  omeprazole (PRILOSEC) 40 MG capsule Take 40 mg by mouth 2 (two) times daily.     Yes Historical Provider, MD  sodium bicarbonate 325 MG tablet Take 650 mg by mouth 2 (two) times daily.   Yes Historical Provider, MD   BP 146/87  Pulse 75  Temp(Src) 97.7 F (36.5 C) (Oral)  Resp 18  Ht 5\' 10"  (1.778 m)  Wt 155 lb (70.308 kg)  BMI 22.24 kg/m2  SpO2 100% Physical Exam  Constitutional: He is oriented to person, place, and time. He appears well-developed and well-nourished. No distress.  HENT:  Head: Normocephalic.    Right Ear: Hearing normal.  Left Ear: Hearing normal.  Nose: Nose normal.  Mouth/Throat: Oropharynx is clear and moist and mucous membranes are normal.  Eyes: Conjunctivae and EOM are normal. Pupils are equal, round, and reactive to light.  Neck: Normal range of motion. Neck supple. Muscular tenderness present.    Cardiovascular: Regular rhythm, S1 normal and S2 normal.  Exam reveals no gallop and no friction rub.   No murmur heard. Pulmonary/Chest: Effort normal and breath sounds normal. No respiratory distress. He exhibits no tenderness.  Abdominal: Soft. Normal appearance and bowel sounds are normal. There is no hepatosplenomegaly. There is no tenderness. There is no rebound, no guarding, no tenderness at McBurney's point and negative Murphy's sign. No hernia.  Musculoskeletal: Normal range of motion.  Neurological: He is alert and oriented to person, place, and time. He has normal strength. No  cranial nerve deficit or sensory deficit. Coordination normal. GCS eye subscore is 4. GCS verbal subscore is 5. GCS motor subscore is 6.  Extraocular muscle movement: normal No visual field cut Pupils: equal and reactive both direct and consensual response is normal No nystagmus present    Sensory function is intact to light touch, pinprick Proprioception intact  Grip strength 5/5 symmetric in upper extremities Lower extremity strength 5/5 against gravity No pronator drift Normal finger to nose bilaterally Normal heel to shin bilaterally     Skin: Skin is warm, dry and intact. No rash noted. No cyanosis.  Psychiatric: He has a normal mood and affect. His speech is normal and behavior is normal. Thought  content normal.    ED Course  Procedures (including critical care time) Labs Review Labs Reviewed  CBC WITH DIFFERENTIAL - Abnormal; Notable for the following:    RBC 3.34 (*)    Hemoglobin 11.2 (*)    HCT 32.3 (*)    Platelets 72 (*)    Eosinophils Relative 11 (*)    All other components within normal limits  COMPREHENSIVE METABOLIC PANEL - Abnormal; Notable for the following:    Potassium 3.2 (*)    Chloride 95 (*)    Glucose, Bld 119 (*)    BUN 28 (*)    Creatinine, Ser 2.34 (*)    AST 52 (*)    GFR calc non Af Amer 24 (*)    GFR calc Af Amer 28 (*)    Anion gap 19 (*)    All other components within normal limits  TROPONIN I  URINALYSIS, ROUTINE W REFLEX MICROSCOPIC    Imaging Review Dg Chest 2 View  08/08/2014   CLINICAL DATA:  Status post fall.  EXAM: CHEST  2 VIEW  COMPARISON:  PA and lateral chest 08/06/2011.  FINDINGS: Heart size and mediastinal contours are within normal limits. Both lungs are clear. Visualized skeletal structures are unremarkable.  IMPRESSION: Negative exam.   Electronically Signed   By: Inge Rise M.D.   On: 08/08/2014 13:26   Ct Head Wo Contrast  08/08/2014   CLINICAL DATA:  Syncope and head injury.  EXAM: CT HEAD WITHOUT CONTRAST   CT CERVICAL SPINE WITHOUT CONTRAST  TECHNIQUE: Multidetector CT imaging of the head and cervical spine was performed following the standard protocol without intravenous contrast. Multiplanar CT image reconstructions of the cervical spine were also generated.  COMPARISON:  08/10/2011  FINDINGS: CT HEAD FINDINGS  There is mild central and cortical atrophy. Periventricular white matter changes are consistent with small vessel disease and appear stable. There is no evidence for hemorrhage, mass lesion, or acute infarction. There is left frontal scalp edema/hematoma without underlying calvarial fracture. There is atherosclerotic calcification of the internal carotid arteries.  CT CERVICAL SPINE FINDINGS  There are significant degenerative changes throughout the cervical spine but most notably at C5-6 and C6-7. There is normal alignment of the cervical spine. There is no evidence for acute fracture or dislocation. Prevertebral soft tissues have a normal appearance. Lung apices have a normal appearance.  IMPRESSION: 1. Atrophy and small vessel disease. 2.  No evidence for acute intracranial abnormality. 3. Left frontal scalp edema/hematoma without underlying calvarial fracture. 4. Degenerative changes in the cervical spine. 5.  No evidence for acute cervical spine abnormality.   Electronically Signed   By: Shon Hale M.D.   On: 08/08/2014 13:32   Ct Cervical Spine Wo Contrast  08/08/2014   CLINICAL DATA:  Syncope and head injury.  EXAM: CT HEAD WITHOUT CONTRAST  CT CERVICAL SPINE WITHOUT CONTRAST  TECHNIQUE: Multidetector CT imaging of the head and cervical spine was performed following the standard protocol without intravenous contrast. Multiplanar CT image reconstructions of the cervical spine were also generated.  COMPARISON:  08/10/2011  FINDINGS: CT HEAD FINDINGS  There is mild central and cortical atrophy. Periventricular white matter changes are consistent with small vessel disease and appear stable. There is no  evidence for hemorrhage, mass lesion, or acute infarction. There is left frontal scalp edema/hematoma without underlying calvarial fracture. There is atherosclerotic calcification of the internal carotid arteries.  CT CERVICAL SPINE FINDINGS  There are significant degenerative changes throughout the cervical spine but  most notably at C5-6 and C6-7. There is normal alignment of the cervical spine. There is no evidence for acute fracture or dislocation. Prevertebral soft tissues have a normal appearance. Lung apices have a normal appearance.  IMPRESSION: 1. Atrophy and small vessel disease. 2.  No evidence for acute intracranial abnormality. 3. Left frontal scalp edema/hematoma without underlying calvarial fracture. 4. Degenerative changes in the cervical spine. 5.  No evidence for acute cervical spine abnormality.   Electronically Signed   By: Shon Hale M.D.   On: 08/08/2014 13:32     EKG Interpretation None      MDM   Final diagnoses:  None   syncope   Patient presents to the ER for evaluation after a fall and syncope. The patient reports that he had weakness when he was walking into [employer], at one point felt like he was falling to the left. He does not have any focal neurologic findings on examination. No signs of stroke. He agrees that his left-sided symptoms have resolved. This might have been a TIA, hard to decide because symptoms were gone at time of arrival. Patient, however, have, hitting the back of his head. CT head and cervical spine were negative. Remainder of his work up was negative other than elevated creatinine over his baseline.  The stomach spleen syncope, patient will be hospitalized for observation on telemetry.    Orpah Greek, MD 08/08/14 1534

## 2014-08-08 NOTE — ED Notes (Signed)
Pt comes via [facility] from [facility], pt noted stumbling around, workers stated that pt fell. Pt then had syncope event with EMS, with hypotension and bradycardia.

## 2014-08-09 ENCOUNTER — Ambulatory Visit: Payer: Commercial Managed Care - HMO | Admitting: Pharmacist Clinician (PhC)/ Clinical Pharmacy Specialist

## 2014-08-09 DIAGNOSIS — M129 Arthropathy, unspecified: Secondary | ICD-10-CM | POA: Diagnosis present

## 2014-08-09 DIAGNOSIS — I951 Orthostatic hypotension: Secondary | ICD-10-CM | POA: Diagnosis present

## 2014-08-09 DIAGNOSIS — E876 Hypokalemia: Secondary | ICD-10-CM | POA: Diagnosis present

## 2014-08-09 DIAGNOSIS — E119 Type 2 diabetes mellitus without complications: Secondary | ICD-10-CM | POA: Diagnosis present

## 2014-08-09 DIAGNOSIS — Z7982 Long term (current) use of aspirin: Secondary | ICD-10-CM | POA: Diagnosis not present

## 2014-08-09 DIAGNOSIS — D696 Thrombocytopenia, unspecified: Secondary | ICD-10-CM | POA: Diagnosis present

## 2014-08-09 DIAGNOSIS — R55 Syncope and collapse: Secondary | ICD-10-CM | POA: Diagnosis present

## 2014-08-09 DIAGNOSIS — Z9861 Coronary angioplasty status: Secondary | ICD-10-CM | POA: Diagnosis not present

## 2014-08-09 DIAGNOSIS — Z91199 Patient's noncompliance with other medical treatment and regimen due to unspecified reason: Secondary | ICD-10-CM | POA: Diagnosis not present

## 2014-08-09 DIAGNOSIS — E785 Hyperlipidemia, unspecified: Secondary | ICD-10-CM | POA: Diagnosis present

## 2014-08-09 DIAGNOSIS — Z888 Allergy status to other drugs, medicaments and biological substances status: Secondary | ICD-10-CM | POA: Diagnosis not present

## 2014-08-09 DIAGNOSIS — I2510 Atherosclerotic heart disease of native coronary artery without angina pectoris: Secondary | ICD-10-CM | POA: Diagnosis present

## 2014-08-09 DIAGNOSIS — I426 Alcoholic cardiomyopathy: Secondary | ICD-10-CM

## 2014-08-09 DIAGNOSIS — R413 Other amnesia: Secondary | ICD-10-CM | POA: Diagnosis present

## 2014-08-09 DIAGNOSIS — Z87891 Personal history of nicotine dependence: Secondary | ICD-10-CM | POA: Diagnosis not present

## 2014-08-09 DIAGNOSIS — N179 Acute kidney failure, unspecified: Secondary | ICD-10-CM | POA: Diagnosis present

## 2014-08-09 DIAGNOSIS — I129 Hypertensive chronic kidney disease with stage 1 through stage 4 chronic kidney disease, or unspecified chronic kidney disease: Secondary | ICD-10-CM | POA: Diagnosis present

## 2014-08-09 DIAGNOSIS — N183 Chronic kidney disease, stage 3 unspecified: Secondary | ICD-10-CM | POA: Diagnosis present

## 2014-08-09 LAB — GLUCOSE, CAPILLARY
GLUCOSE-CAPILLARY: 148 mg/dL — AB (ref 70–99)
GLUCOSE-CAPILLARY: 124 mg/dL — AB (ref 70–99)
Glucose-Capillary: 119 mg/dL — ABNORMAL HIGH (ref 70–99)
Glucose-Capillary: 137 mg/dL — ABNORMAL HIGH (ref 70–99)

## 2014-08-09 LAB — BASIC METABOLIC PANEL
Anion gap: 11 (ref 5–15)
BUN: 26 mg/dL — ABNORMAL HIGH (ref 6–23)
CHLORIDE: 101 meq/L (ref 96–112)
CO2: 33 mEq/L — ABNORMAL HIGH (ref 19–32)
CREATININE: 2.21 mg/dL — AB (ref 0.50–1.35)
Calcium: 8.7 mg/dL (ref 8.4–10.5)
GFR calc Af Amer: 30 mL/min — ABNORMAL LOW (ref >90)
GFR calc non Af Amer: 26 mL/min — ABNORMAL LOW (ref >90)
Glucose, Bld: 104 mg/dL — ABNORMAL HIGH (ref 70–99)
POTASSIUM: 3.5 meq/L — AB (ref 3.7–5.3)
Sodium: 145 mEq/L (ref 137–147)

## 2014-08-09 LAB — HEMOGLOBIN A1C
Hgb A1c MFr Bld: 5.9 % — ABNORMAL HIGH (ref <5.7)
Mean Plasma Glucose: 123 mg/dL — ABNORMAL HIGH (ref <117)

## 2014-08-09 LAB — TSH: TSH: 4.41 u[IU]/mL (ref 0.350–4.500)

## 2014-08-09 LAB — VITAMIN B12: VITAMIN B 12: 470 pg/mL (ref 211–911)

## 2014-08-09 LAB — FOLATE RBC: RBC FOLATE: 781 ng/mL — AB (ref >280)

## 2014-08-09 MED ORDER — GLUCERNA SHAKE PO LIQD
237.0000 mL | Freq: Two times a day (BID) | ORAL | Status: DC
  Administered 2014-08-09 (×2): 237 mL via ORAL

## 2014-08-09 MED ORDER — SODIUM CHLORIDE 0.9 % IV SOLN
INTRAVENOUS | Status: DC
  Administered 2014-08-09 (×2): via INTRAVENOUS

## 2014-08-09 NOTE — Progress Notes (Signed)
[facility] PROGRESS NOTE  Bryan Pham WER:[id number] DOB: 01/16/1929 DOA: 08/08/2014 PCP: Merrilee Seashore, MD  Assessment/Plan: Syncope  -secondary to orthostatic hypotension -long h/o Orthostatic hypotension, per cards notes, lasix and BB were stopped in past due to this. -Recently restarted on Po lasix -d/c lasix. Hold cozaar. Compression stockings.  -PT eval.  CAD (coronary artery disease)  -stable, continue home regimen coreg/statin  AKI on CKD (chronic kidney disease), stage III - Likely volume depleted  -IVF, hold ARB, Bmet in am  Thrombocytopenia, unspecified: chronic   Essential hypertension  -ARB on hold  Impaired memory/suspect Early dementia -B12, TSH-normal  DM: per notes, not taking [place].  -SSI, FU hgb a1C   Cardiomyopathy: due to CAD and ETOH, EF of 30-35% no CHF. Monitor for fluid overload IVF for now, ARb and lasix on hold  Ambulate, PT eval   Code Status: Full Code Family Communication: none at bedside Disposition Plan: home tomorrow if stable   HPI/Subjective: Feels ok, but hasnt gotten OOB yet  Objective: Filed Vitals:   08/09/14 0500  BP:   Pulse: 60  Temp: 98 F (36.7 C)  Resp: 17    Intake/Output Summary (Last 24 hours) at 08/09/14 0923 Last data filed at 08/08/14 2300  Gross per 24 hour  Intake    480 ml  Output    500 ml  Net    -20 ml   Filed Weights   08/08/14 1238 08/08/14 1706  Weight: 70.308 kg (155 lb) 63.5 kg (139 lb 15.9 oz)    Exam:   General:  AAOx3  Cardiovascular: S1S2/RRR  Respiratory: CTAB  Abdomen: soft, NT, BS present  Musculoskeletal: no edema c/c   Data Reviewed: Basic Metabolic Panel:  Recent Labs Lab 08/08/14 1242 08/08/14 2100 08/09/14 0620  NA 143  --  145  K 3.2*  --  3.5*  CL 95*  --  101  CO2 29  --  33*  GLUCOSE 119*  --  104*  BUN 28*  --  26*  CREATININE 2.34*  --  2.21*  CALCIUM 9.4  --  8.7  MG  --  1.5  --    Liver Function Tests:  Recent Labs Lab  08/08/14 1242  AST 52*  ALT 27  ALKPHOS 80  BILITOT 1.2  PROT 6.6  ALBUMIN 4.1   No results found for this basename: LIPASE, AMYLASE,  in the last 168 hours No results found for this basename: AMMONIA,  in the last 168 hours CBC:  Recent Labs Lab 08/08/14 1242  WBC 4.8  NEUTROABS 3.2  HGB 11.2*  HCT 32.3*  MCV 96.7  PLT 72*   Cardiac Enzymes:  Recent Labs Lab 08/08/14 1242  TROPONINI <0.30   BNP (last 3 results) No results found for this basename: PROBNP,  in the last 8760 hours CBG:  Recent Labs Lab 08/08/14 2356 08/09/14 0646  GLUCAP 116* 119*    No results found for this or any previous visit (from the past 240 hour(s)).   Studies: Dg Chest 2 View  08/08/2014   CLINICAL DATA:  Status post fall.  EXAM: CHEST  2 VIEW  COMPARISON:  PA and lateral chest 08/06/2011.  FINDINGS: Heart size and mediastinal contours are within normal limits. Both lungs are clear. Visualized skeletal structures are unremarkable.  IMPRESSION: Negative exam.   Electronically Signed   By: Inge Rise M.D.   On: 08/08/2014 13:26   Ct Head Wo Contrast  08/08/2014   CLINICAL DATA:  Syncope  and head injury.  EXAM: CT HEAD WITHOUT CONTRAST  CT CERVICAL SPINE WITHOUT CONTRAST  TECHNIQUE: Multidetector CT imaging of the head and cervical spine was performed following the standard protocol without intravenous contrast. Multiplanar CT image reconstructions of the cervical spine were also generated.  COMPARISON:  08/10/2011  FINDINGS: CT HEAD FINDINGS  There is mild central and cortical atrophy. Periventricular white matter changes are consistent with small vessel disease and appear stable. There is no evidence for hemorrhage, mass lesion, or acute infarction. There is left frontal scalp edema/hematoma without underlying calvarial fracture. There is atherosclerotic calcification of the internal carotid arteries.  CT CERVICAL SPINE FINDINGS  There are significant degenerative changes throughout the  cervical spine but most notably at C5-6 and C6-7. There is normal alignment of the cervical spine. There is no evidence for acute fracture or dislocation. Prevertebral soft tissues have a normal appearance. Lung apices have a normal appearance.  IMPRESSION: 1. Atrophy and small vessel disease. 2.  No evidence for acute intracranial abnormality. 3. Left frontal scalp edema/hematoma without underlying calvarial fracture. 4. Degenerative changes in the cervical spine. 5.  No evidence for acute cervical spine abnormality.   Electronically Signed   By: Shon Hale M.D.   On: 08/08/2014 13:32   Ct Cervical Spine Wo Contrast  08/08/2014   CLINICAL DATA:  Syncope and head injury.  EXAM: CT HEAD WITHOUT CONTRAST  CT CERVICAL SPINE WITHOUT CONTRAST  TECHNIQUE: Multidetector CT imaging of the head and cervical spine was performed following the standard protocol without intravenous contrast. Multiplanar CT image reconstructions of the cervical spine were also generated.  COMPARISON:  08/10/2011  FINDINGS: CT HEAD FINDINGS  There is mild central and cortical atrophy. Periventricular white matter changes are consistent with small vessel disease and appear stable. There is no evidence for hemorrhage, mass lesion, or acute infarction. There is left frontal scalp edema/hematoma without underlying calvarial fracture. There is atherosclerotic calcification of the internal carotid arteries.  CT CERVICAL SPINE FINDINGS  There are significant degenerative changes throughout the cervical spine but most notably at C5-6 and C6-7. There is normal alignment of the cervical spine. There is no evidence for acute fracture or dislocation. Prevertebral soft tissues have a normal appearance. Lung apices have a normal appearance.  IMPRESSION: 1. Atrophy and small vessel disease. 2.  No evidence for acute intracranial abnormality. 3. Left frontal scalp edema/hematoma without underlying calvarial fracture. 4. Degenerative changes in the cervical  spine. 5.  No evidence for acute cervical spine abnormality.   Electronically Signed   By: Shon Hale M.D.   On: 08/08/2014 13:32    Scheduled Meds: . atorvastatin  40 mg Oral q1800  . calcitRIOL  0.5 mcg Oral Daily  . carvedilol  12.5 mg Oral BID WC  . docusate sodium  100 mg Oral BID  . insulin aspart  0-9 Units Subcutaneous TID WC  . levothyroxine  25 mcg Oral QAC breakfast  . pantoprazole  40 mg Oral Daily  . sodium chloride  3 mL Intravenous Q12H   Continuous Infusions: . sodium chloride 75 mL/hr at 08/09/14 0911   Antibiotics Given (last 72 hours)   None      Principal Problem:   Syncope Active Problems:   Orthostatic hypotension   CAD (coronary artery disease)   Hyperlipidemia   CKD (chronic kidney disease), stage III   Thrombocytopenia, unspecified   Essential hypertension   Hypokalemia    Time spent: 73min    Kasra Melvin  [facility]  Pager [phone]. If 7PM-7AM, please contact night-coverage at [email or web address], password [facility] 08/09/2014, 9:23 AM  LOS: 1 day

## 2014-08-09 NOTE — Care Management Note (Signed)
    Page 1 of 1   08/11/2014     7:27:52 AM CARE MANAGEMENT NOTE 08/11/2014  Patient:  Bryan Pham, Bryan Pham   Account Number:  [id number]  Date Initiated:  08/09/2014  Documentation initiated by:  AMERSON,JULIE  Subjective/Objective Assessment:   Pt adm on 08/08/14 with syncope, Cardiomyopathy, ARF.  PTA, pt independent, lives alone.     Action/Plan:   PT consult pending to assess for home needs.  Will follow progress.   Anticipated DC Date:  08/11/2014   Anticipated DC Plan:  [place]  CM consult      [facility] Choice  HOME HEALTH   Choice offered to / List presented to:  C-1 Patient        [place] arranged  HH-1 RN  [place].   Status of service:  Completed, signed off Medicare Important Message given?  NO (If response is "NO", the following Medicare IM given date fields will be blank) Date Medicare IM given:   Medicare IM given by:   Date Additional Medicare IM given:   Additional Medicare IM given by:    Discharge Disposition:  [place]  Per UR Regulation:  Reviewed for med. necessity/level of care/duration of stay  If discussed at [place] of Stay Meetings, dates discussed:    Comments:  08/10/14 12:40 CM received call from RN stating pt could not wait for CM bc of [place] game being on today.  CM spoke with pt on phone for choice and pt choses [facility] to render HHPT/RN. Address and contact information verified with pt and add daughter's contact bc of [place]: [place]. Referral made to [facility] rep, Stanton Kidney.  No other CM needs were communicated.  Bryan Pham, BSN, CM [phone].

## 2014-08-09 NOTE — Progress Notes (Signed)
INITIAL NUTRITION ASSESSMENT  DOCUMENTATION CODES Per approved criteria  -Not Applicable   INTERVENTION: Glucerna Shake po BID, each supplement provides 220 kcal and 10 grams of protein  NUTRITION DIAGNOSIS: Inadequate oral intake related to chronic illness as evidenced by suspected weight loss.   Goal: Pt to meet >/= 90% of their estimated nutrition needs   Monitor:  Weight trend, po intake, acceptance of supplements, I/O's  Reason for Assessment: MST  80 y.o. male  Admitting Dx: Syncope  ASSESSMENT: 80 y.o. male with h/o hypertension orthostatic hypotension, CKD, peripheral edema, cardiomyopathy with EF 30-35% diabetes, presents after having 2 syncopal episodes at [facility].  - Pt reports that he has been eating well. Meal completion is recorded as 75%. Chart history shows weight loss, but pt receiving lasix. He says that he may have lost weight, but was unsure. He agreed to try nutritional supplements.  - Pt with mild wasting of the temple and clavicles.  Labs: Na Low BUN elevated  Height: Ht Readings from Last 1 Encounters:  08/08/14 5\' 10"  (1.778 m)    Weight: Wt Readings from Last 1 Encounters:  08/08/14 139 lb 15.9 oz (63.5 kg)    Ideal Body Weight: 73 kg  % Ideal Body Weight: 87%  Wt Readings from Last 10 Encounters:  08/08/14 139 lb 15.9 oz (63.5 kg)  08/05/14 162 lb (73.483 kg)  07/13/14 157 lb 4.8 oz (71.351 kg)  06/26/14 154 lb 4.8 oz (69.99 kg)  06/12/14 148 lb 1.6 oz (67.178 kg)  11/13/13 170 lb 1.6 oz (77.157 kg)  07/13/13 162 lb 14.4 oz (73.891 kg)  05/24/13 164 lb 11.2 oz (74.707 kg)  05/22/13 163 lb 1.6 oz (73.982 kg)  10/20/11 160 lb (72.576 kg)    Usual Body Weight: unknown  % Usual Body Weight: n/a  BMI:  Body mass index is 20.09 kg/(m^2).  Estimated Nutritional Needs: Kcal: 1600-1900 Protein: 75-90 g Fluid: 1.9 L/day  Skin: intact  Diet Order: Cardiac  EDUCATION NEEDS: -Education needs addressed   Intake/Output  Summary (Last 24 hours) at 08/09/14 1106 Last data filed at 08/09/14 0800  Gross per 24 hour  Intake    720 ml  Output    500 ml  Net    220 ml    Last BM: 9/25   Labs:   Recent Labs Lab 08/08/14 1242 08/08/14 2100 08/09/14 0620  NA 143  --  145  K 3.2*  --  3.5*  CL 95*  --  101  CO2 29  --  33*  BUN 28*  --  26*  CREATININE 2.34*  --  2.21*  CALCIUM 9.4  --  8.7  MG  --  1.5  --   GLUCOSE 119*  --  104*    CBG (last 3)   Recent Labs  08/08/14 2356 08/09/14 0646  GLUCAP 116* 119*    Scheduled Meds: . atorvastatin  40 mg Oral q1800  . calcitRIOL  0.5 mcg Oral Daily  . carvedilol  12.5 mg Oral BID WC  . docusate sodium  100 mg Oral BID  . insulin aspart  0-9 Units Subcutaneous TID WC  . levothyroxine  25 mcg Oral QAC breakfast  . pantoprazole  40 mg Oral Daily  . sodium chloride  3 mL Intravenous Q12H    Continuous Infusions: . sodium chloride 75 mL/hr at 08/09/14 1700    Past Medical History  Diagnosis Date  . Arthritis   . Diabetes mellitus   . Hyperlipidemia   .  Hypertension   . Leg swelling     left leg   . Abdominal pain 10/12    pt was having abdominal pain over whole abdominal area   . CAD (coronary artery disease)     multiple PCI to diag system in 1990; echo 08/09/11- EF 40-45%, mild AR and mild MR; myoview 10/22/2009-EF 53%, defect in the inferior region, no ischemai or scar  . Orthostatic hypotension 07/2012    9mmHg drop in SBP, lasix and atenolol was D/C  . Kidney disease     mild  . Family history of anesthesia complication     daughter has nausea  . GERD (gastroesophageal reflux disease)     Past Surgical History  Procedure Laterality Date  . Back surgery  2009  . Cholecystectomy  09/07/11  . Cardiac catheterization  04/16/2003    EF >55%, significant HTN, mod disease at the distal portion of the LAD which is not amenable to PCI and a mod lesion in the prox portion of the OM 1, medical therapy  . Cardiac catheterization   06/11/1999    nl LV function, MVP, minor irregularities in the LAD and diag with no high grade stenosis  . Cardiac catheterization  06/14/1997    90% narrowing of the ostial portion of the first diag, no restenosis of prior PCI in LAD or OM 1, nl EF  . Coronary angioplasty  08/01/1996    2V CAD involving the distal LAD and OM1, angioplasty of distal LAD and the [place] RD, LDN

## 2014-08-09 NOTE — Progress Notes (Signed)
PT Cancellation Note - attempted eval  Patient Details Name: Bryan Pham MRN: [id number] DOB: 01/16/1929   Cancelled Treatment:       I started pts eval.  Pt independent prior to admit.  Lives alone.  No devices.  Recent fall in shower - slipped getting out.  I did pts vitals prior to eval while sitting in chair.  Pt with pulse 66bpm, pulse ox 100% on room air and his BP 195/67.  I notified nursing and she checked both arms manually and got same reading 190/70. (he was 111/70 earlier this morning per report)  I am unable to complete eval at this time - nursing notifying MD.  Pt aware of delay and frustrated with situation.  Will try back later today.   Loyal Buba 08/09/2014, 11:33 AM 08/09/2014   Rande Lawman, PT

## 2014-08-09 NOTE — Progress Notes (Signed)
UR completed 

## 2014-08-09 NOTE — Evaluation (Signed)
Physical Therapy Evaluation Patient Details Name: Bryan Pham MRN: [id number] DOB: 1929-03-18 Today's Date: 08/09/2014   History of Present Illness  Bryan Pham is a 80 y.o. male  With h/o hypertension orthostatic hypotension, CKD, peripheral edema, cardiomyopathy with EF 30-35% diabetes, presents after having 2 syncopal episodes at [facility]. Per review of outpatient records, recently started on lasix for peripheral edema, concerns about medication compliance, confusion about medications  Clinical Impression  Ive attempted to see pt twice today - earlier today BP 195/67.   Now pts sitting BP 131/51.  i checked pt BP standing and pt got progressively dizzy with standing BP 84/46 and had to return to sitting.  Unable to assess safety with mobilty.  Pt has history of recent fall at home in shower and lives alone. Pt has no equipment at home.  Unsure DC plans at this time - if safe to return home i recommend [facility] services to follow    Follow Up Recommendations Home health PT    Equipment Recommendations   (to be assessed)    Recommendations for Other Services       Precautions / Restrictions Precautions Precautions: Fall Precaution Comments: Pt passed out twice in [employer] Restrictions Weight Bearing Restrictions: Yes      Mobility  Bed Mobility                  Transfers Overall transfer level: Needs assistance Equipment used: None             General transfer comment: pt gets progressively dizzy with standing  Ambulation/Gait             General Gait Details: unable to assess due to dropping BP with standing  Stairs            Wheelchair Mobility    Modified Rankin (Stroke Patients Only)       Balance                                             Pertinent Vitals/Pain Pain Assessment: No/denies pain    Home Living Family/patient expects to be discharged to:: Private residence Living Arrangements: Alone Available Help  at Discharge: Family;Friend(s) Type of Home: Apartment Home Access: Stairs to enter   [employer] of Steps: Pt says he has 14 steps to enter his apartment and they havent been a problem in the past Home Layout: One level Home Equipment: None Additional Comments: pt has thought about a cane but not ready to get one yet    Prior Function Level of Independence: Independent         Comments: pt was independent with no devices.  driving ,etc     Hand Dominance        Extremity/Trunk Assessment               Lower Extremity Assessment: Generalized weakness (Full AROM - denies any hip or knee problems)      Cervical / Trunk Assessment: Normal  Communication   Communication: No difficulties  Cognition Arousal/Alertness: Awake/alert Behavior During Therapy: WFL for tasks assessed/performed Overall Cognitive Status: Within Functional Limits for tasks assessed                      General Comments      Exercises        Assessment/Plan  PT Assessment Patient needs continued PT services  PT Diagnosis Generalized weakness;Other (comment);Difficulty walking   PT Problem List Decreased activity tolerance  PT Treatment Interventions Gait training;DME instruction;Functional mobility training;Therapeutic activities;Therapeutic exercise;Patient/family education   PT Goals (Current goals can be found in the Care Plan section) Acute Rehab PT Goals Patient Stated Goal: to get home ASAP PT Goal Formulation: With patient Time For Goal Achievement: 08/23/14 Potential to Achieve Goals: Good    Frequency Min 3X/week   Barriers to discharge        Co-evaluation               End of Session   Activity Tolerance: Treatment limited secondary to medical complications (Comment) (limited by dizziness and dropping BP) Patient left: in chair;with chair alarm set;with call bell/phone within reach           Time: 1355-1425 PT Time Calculation  (min): 30 min    Charges:   PT Evaluation $Initial PT Evaluation Tier I: 1 Procedure PT Treatments $Therapeutic Activity: 8-22 mins   PT G Codes:          Loyal Buba 08/09/2014, 2:30 PM 08/09/2014   Rande Lawman, PT

## 2014-08-10 DIAGNOSIS — R413 Other amnesia: Secondary | ICD-10-CM | POA: Diagnosis present

## 2014-08-10 LAB — CBC
HCT: 28.2 % — ABNORMAL LOW (ref 39.0–52.0)
Hemoglobin: 9.6 g/dL — ABNORMAL LOW (ref 13.0–17.0)
MCH: 34.2 pg — ABNORMAL HIGH (ref 26.0–34.0)
MCHC: 34 g/dL (ref 30.0–36.0)
MCV: 100.4 fL — ABNORMAL HIGH (ref 78.0–100.0)
PLATELETS: 51 10*3/uL — AB (ref 150–400)
RBC: 2.81 MIL/uL — AB (ref 4.22–5.81)
RDW: 12.9 % (ref 11.5–15.5)
WBC: 3.2 10*3/uL — ABNORMAL LOW (ref 4.0–10.5)

## 2014-08-10 LAB — BASIC METABOLIC PANEL
Anion gap: 12 (ref 5–15)
BUN: 21 mg/dL (ref 6–23)
CHLORIDE: 101 meq/L (ref 96–112)
CO2: 27 mEq/L (ref 19–32)
Calcium: 8.5 mg/dL (ref 8.4–10.5)
Creatinine, Ser: 1.77 mg/dL — ABNORMAL HIGH (ref 0.50–1.35)
GFR calc Af Amer: 39 mL/min — ABNORMAL LOW (ref >90)
GFR, EST NON AFRICAN AMERICAN: 34 mL/min — AB (ref >90)
GLUCOSE: 114 mg/dL — AB (ref 70–99)
POTASSIUM: 3.2 meq/L — AB (ref 3.7–5.3)
SODIUM: 140 meq/L (ref 137–147)

## 2014-08-10 LAB — GLUCOSE, CAPILLARY
Glucose-Capillary: 128 mg/dL — ABNORMAL HIGH (ref 70–99)
Glucose-Capillary: 124 mg/dL — ABNORMAL HIGH (ref 70–99)

## 2014-08-10 MED ORDER — LOSARTAN POTASSIUM 50 MG PO TABS: 50.0000 mg | ORAL_TABLET | Freq: Every day | ORAL | Status: DC

## 2014-08-10 MED ORDER — POTASSIUM CHLORIDE CRYS ER 20 MEQ PO TBCR
40.0000 meq | EXTENDED_RELEASE_TABLET | Freq: Once | ORAL | Status: AC
  Administered 2014-08-10: 40 meq via ORAL
  Filled 2014-08-10: qty 2

## 2014-08-10 NOTE — Progress Notes (Signed)
Pt daughter calls RN to the room informing her pt had a moment of confusion and not able to answer questions right; pt assessed and pt able to answer all questions correctly; pt A&O x4; denies any discomfort or pain; pt daughter voices relieve; pt discharge education and instructions completed with pt and daughter at bedside; both voices understanding and denies any questions; pt IV and telemetry removed; pt discharge home with daughter to transport him home; case management in to set up [place] needs. Will transport pt out with belongings at side and daughter in wheelchair. P Amo Medea Deines RN.

## 2014-08-10 NOTE — Discharge Summary (Signed)
Physician Discharge Summary  Bryan Pham PJA:[id number] DOB: 18-Jul-1929 DOA: 08/08/2014  PCP: Merrilee Seashore, MD  Admit date: 08/08/2014 Discharge date: 08/10/2014  Time spent: 45 minutes  Recommendations for Outpatient Follow-up:  1. PCP in 1 week 2. Please caution with new BP meds or diuretics which can worsen his Orthostatic Hypotension  Discharge Diagnoses:  Principal Problem:   Syncope Active Problems:   Orthostatic hypotension   CAD (coronary artery disease)   Hyperlipidemia   CKD (chronic kidney disease), stage III   Thrombocytopenia, unspecified   Alcoholic cardiomyopathy   Essential hypertension   Hypokalemia   Memory loss   Discharge Condition: stable  Diet recommendation: low sodium  Filed Weights   08/08/14 1238 08/08/14 1706 08/10/14 0500  Weight: 70.308 kg (155 lb) 63.5 kg (139 lb 15.9 oz) 65.6 kg (144 lb 10 oz)    History of present illness:  Bryan Pham is a 80 y.o. male  With h/o hypertension orthostatic hypotension, CKD, peripheral edema, cardiomyopathy with EF 30-35% diabetes, presents after having 2 syncopal episodes at [facility]. Per review of outpatient records, recently started on lasix for peripheral edema, concerns about medication compliance, confusion about medications. In ED, creatinine 2.34 (usually ranges 1.6 -2.0), potassium 3.2, CT brain and C spine shows nothing acute, except scalp hematoma, negative UA and CXR. Orthostatics: supine 171/64, standing 68/52  Hospital Course:  Syncope  -secondary to orthostatic hypotension  -long h/o Orthostatic hypotension, per cards notes, lasix and BB were stopped in past due to this.  suspect due to meds and component of autonomic neuropathy from long standing alcohoism -Recently restarted on Po lasix per Renal -stopped lasi, hydrated with NS,  Compression stockings recommended -PT eval completed, home health PT recommended -orthostatics vitals improved compared to baseline and not symptomatic  with ambulation by the time of discharge  CAD (coronary artery disease)  -stable, continue home regimen coreg/statin   AKI on CKD (chronic kidney disease), stage III  - due to diuretics, volume depletion -ARB and lasix were held initially and then once creatinine improved to baseline ARb was resumed at a lower dose  Thrombocytopenia, unspecified: chronic   Essential hypertension  -ARB resumed at lower dose  Impaired memory/suspect Early dementia  -B12, TSH-normal  -suspect ETOh induced, pt reports that he stopped drinking 3 months ago -Family trying to get him into retirement home  DM: per notes, not taking [place].  -SSI, FU hgb a1C 5.9  Cardiomyopathy: due to CAD and ETOH, EF of 30-35%  -compensated, no evidence of fluid overload  -lasix stopped as noted above -ARB resumed at 50mg    Discharge Exam: Filed Vitals:   08/10/14 0917  BP:   Pulse:   Temp: 98.3 F (36.8 C)  Resp: 18    General: AAOx3 Cardiovascular: S1S2/RRR Respiratory: CTAB  Discharge Instructions You were cared for by a hospitalist during your hospital stay. If you have any questions about your discharge medications or the care you received while you were in the hospital after you are discharged, you can call the unit and asked to speak with the hospitalist on call if the hospitalist that took care of you is not available. Once you are discharged, your primary care physician will handle any further medical issues. Please note that NO REFILLS for any discharge medications will be authorized once you are discharged, as it is imperative that you return to your primary care physician (or establish a relationship with a primary care physician if you do not have one) for your  aftercare needs so that they can reassess your need for medications and monitor your lab values.  Discharge Instructions   Diet - low sodium heart healthy    Complete by:  As directed      Increase activity slowly    Complete by:  As  directed           Current Discharge Medication List    CONTINUE these medications which have CHANGED   Details  losartan (COZAAR) 50 MG tablet Take 1 tablet (50 mg total) by mouth daily. Qty: 30 tablet, Refills: 0      CONTINUE these medications which have NOT CHANGED   Details  aspirin 81 MG tablet Take 81 mg by mouth daily.     atorvastatin (LIPITOR) 40 MG tablet Take 40 mg by mouth daily.    calcitRIOL (ROCALTROL) 0.5 MCG capsule Take 0.5 mcg by mouth daily.    carvedilol (COREG) 12.5 MG tablet Take 12.5 mg by mouth 2 (two) times daily with a meal.    HYDROcodone-acetaminophen (NORCO/VICODIN) 5-325 MG per tablet Take 1 tablet by mouth 3 (three) times daily as needed (for pain).     JANUVIA 50 MG tablet Take 1 tablet by mouth daily.    levothyroxine (SYNTHROID, LEVOTHROID) 25 MCG tablet Take 25 mcg by mouth daily.      omeprazole (PRILOSEC) 40 MG capsule Take 40 mg by mouth 2 (two) times daily.        STOP taking these medications     furosemide (LASIX) 40 MG tablet      sodium bicarbonate 325 MG tablet        Allergies  Allergen Reactions  . Benadryl [Diphenhydramine Hcl]     shaking   Follow-up Information   Follow up with [facility], MD. Schedule an appointment as soon as possible for a visit in 1 week.   Specialty:  Internal Medicine   Contact information:   [address] [place] [address] [phone]        The results of significant diagnostics from this hospitalization (including imaging, microbiology, ancillary and laboratory) are listed below for reference.    Significant Diagnostic Studies: Dg Chest 2 View  08/08/2014   CLINICAL DATA:  Status post fall.  EXAM: CHEST  2 VIEW  COMPARISON:  PA and lateral chest 08/06/2011.  FINDINGS: Heart size and mediastinal contours are within normal limits. Both lungs are clear. Visualized skeletal structures are unremarkable.  IMPRESSION: Negative exam.   Electronically Signed    By: Inge Rise M.D.   On: 08/08/2014 13:26   Ct Head Wo Contrast  08/08/2014   CLINICAL DATA:  Syncope and head injury.  EXAM: CT HEAD WITHOUT CONTRAST  CT CERVICAL SPINE WITHOUT CONTRAST  TECHNIQUE: Multidetector CT imaging of the head and cervical spine was performed following the standard protocol without intravenous contrast. Multiplanar CT image reconstructions of the cervical spine were also generated.  COMPARISON:  08/10/2011  FINDINGS: CT HEAD FINDINGS  There is mild central and cortical atrophy. Periventricular white matter changes are consistent with small vessel disease and appear stable. There is no evidence for hemorrhage, mass lesion, or acute infarction. There is left frontal scalp edema/hematoma without underlying calvarial fracture. There is atherosclerotic calcification of the internal carotid arteries.  CT CERVICAL SPINE FINDINGS  There are significant degenerative changes throughout the cervical spine but most notably at C5-6 and C6-7. There is normal alignment of the cervical spine. There is no evidence for acute fracture or dislocation. Prevertebral  soft tissues have a normal appearance. Lung apices have a normal appearance.  IMPRESSION: 1. Atrophy and small vessel disease. 2.  No evidence for acute intracranial abnormality. 3. Left frontal scalp edema/hematoma without underlying calvarial fracture. 4. Degenerative changes in the cervical spine. 5.  No evidence for acute cervical spine abnormality.   Electronically Signed   By: Shon Hale M.D.   On: 08/08/2014 13:32   Ct Cervical Spine Wo Contrast  08/08/2014   CLINICAL DATA:  Syncope and head injury.  EXAM: CT HEAD WITHOUT CONTRAST  CT CERVICAL SPINE WITHOUT CONTRAST  TECHNIQUE: Multidetector CT imaging of the head and cervical spine was performed following the standard protocol without intravenous contrast. Multiplanar CT image reconstructions of the cervical spine were also generated.  COMPARISON:  08/10/2011  FINDINGS: CT HEAD  FINDINGS  There is mild central and cortical atrophy. Periventricular white matter changes are consistent with small vessel disease and appear stable. There is no evidence for hemorrhage, mass lesion, or acute infarction. There is left frontal scalp edema/hematoma without underlying calvarial fracture. There is atherosclerotic calcification of the internal carotid arteries.  CT CERVICAL SPINE FINDINGS  There are significant degenerative changes throughout the cervical spine but most notably at C5-6 and C6-7. There is normal alignment of the cervical spine. There is no evidence for acute fracture or dislocation. Prevertebral soft tissues have a normal appearance. Lung apices have a normal appearance.  IMPRESSION: 1. Atrophy and small vessel disease. 2.  No evidence for acute intracranial abnormality. 3. Left frontal scalp edema/hematoma without underlying calvarial fracture. 4. Degenerative changes in the cervical spine. 5.  No evidence for acute cervical spine abnormality.   Electronically Signed   By: Shon Hale M.D.   On: 08/08/2014 13:32    Microbiology: No results found for this or any previous visit (from the past 240 hour(s)).   Labs: Basic Metabolic Panel:  Recent Labs Lab 08/08/14 1242 08/08/14 2100 08/09/14 0620 08/10/14 0455  NA 143  --  145 140  K 3.2*  --  3.5* 3.2*  CL 95*  --  101 101  CO2 29  --  33* 27  GLUCOSE 119*  --  104* 114*  BUN 28*  --  26* 21  CREATININE 2.34*  --  2.21* 1.77*  CALCIUM 9.4  --  8.7 8.5  MG  --  1.5  --   --    Liver Function Tests:  Recent Labs Lab 08/08/14 1242  AST 52*  ALT 27  ALKPHOS 80  BILITOT 1.2  PROT 6.6  ALBUMIN 4.1   No results found for this basename: LIPASE, AMYLASE,  in the last 168 hours No results found for this basename: AMMONIA,  in the last 168 hours CBC:  Recent Labs Lab 08/08/14 1242 08/10/14 0455  WBC 4.8 3.2*  NEUTROABS 3.2  --   HGB 11.2* 9.6*  HCT 32.3* 28.2*  MCV 96.7 100.4*  PLT 72* 51*   Cardiac  Enzymes:  Recent Labs Lab 08/08/14 1242  TROPONINI <0.30   BNP: BNP (last 3 results) No results found for this basename: PROBNP,  in the last 8760 hours CBG:  Recent Labs Lab 08/09/14 0646 08/09/14 1103 08/09/14 1627 08/09/14 2032 08/10/14 0623  GLUCAP 119* 137* 148* 124* 128*       Signed:  Cheyna Retana  [facility] 08/10/2014, 9:47 AM

## 2014-08-16 ENCOUNTER — Ambulatory Visit: Payer: Commercial Managed Care - HMO | Admitting: Pharmacist Clinician (PhC)/ Clinical Pharmacy Specialist

## 2014-08-23 ENCOUNTER — Other Ambulatory Visit: Payer: Self-pay

## 2014-08-27 ENCOUNTER — Encounter (HOSPITAL_COMMUNITY): Payer: Self-pay | Admitting: Emergency Medicine

## 2014-08-27 ENCOUNTER — Emergency Department (HOSPITAL_COMMUNITY)
Admission: EM | Admit: 2014-08-27 | Discharge: 2014-08-27 | Disposition: A | Discharge status: Home / Self Care | Payer: Medicare HMO | Attending: Emergency Medicine | Admitting: Emergency Medicine

## 2014-08-27 DIAGNOSIS — Z87891 Personal history of nicotine dependence: Secondary | ICD-10-CM | POA: Diagnosis not present

## 2014-08-27 DIAGNOSIS — I10 Essential (primary) hypertension: Secondary | ICD-10-CM | POA: Insufficient documentation

## 2014-08-27 DIAGNOSIS — Z9889 Other specified postprocedural states: Secondary | ICD-10-CM | POA: Insufficient documentation

## 2014-08-27 DIAGNOSIS — F0391 Unspecified dementia with behavioral disturbance: Secondary | ICD-10-CM

## 2014-08-27 DIAGNOSIS — M1990 Unspecified osteoarthritis, unspecified site: Secondary | ICD-10-CM | POA: Diagnosis not present

## 2014-08-27 DIAGNOSIS — Z9861 Coronary angioplasty status: Secondary | ICD-10-CM | POA: Diagnosis not present

## 2014-08-27 DIAGNOSIS — Z79899 Other long term (current) drug therapy: Secondary | ICD-10-CM | POA: Diagnosis not present

## 2014-08-27 DIAGNOSIS — Z87448 Personal history of other diseases of urinary system: Secondary | ICD-10-CM | POA: Diagnosis not present

## 2014-08-27 DIAGNOSIS — E119 Type 2 diabetes mellitus without complications: Secondary | ICD-10-CM | POA: Insufficient documentation

## 2014-08-27 DIAGNOSIS — R440 Auditory hallucinations: Secondary | ICD-10-CM | POA: Diagnosis present

## 2014-08-27 DIAGNOSIS — I2510 Atherosclerotic heart disease of native coronary artery without angina pectoris: Secondary | ICD-10-CM | POA: Diagnosis not present

## 2014-08-27 DIAGNOSIS — K219 Gastro-esophageal reflux disease without esophagitis: Secondary | ICD-10-CM | POA: Insufficient documentation

## 2014-08-27 DIAGNOSIS — Z7982 Long term (current) use of aspirin: Secondary | ICD-10-CM | POA: Diagnosis not present

## 2014-08-27 DIAGNOSIS — E785 Hyperlipidemia, unspecified: Secondary | ICD-10-CM | POA: Diagnosis not present

## 2014-08-27 LAB — URINALYSIS, ROUTINE W REFLEX MICROSCOPIC
Bilirubin Urine: NEGATIVE
GLUCOSE, UA: NEGATIVE mg/dL
HGB URINE DIPSTICK: NEGATIVE
Ketones, ur: NEGATIVE mg/dL
Leukocytes, UA: NEGATIVE
Nitrite: NEGATIVE
PH: 7.5 (ref 5.0–8.0)
Protein, ur: NEGATIVE mg/dL
SPECIFIC GRAVITY, URINE: 1.007 (ref 1.005–1.030)
Urobilinogen, UA: 0.2 mg/dL (ref 0.0–1.0)

## 2014-08-27 LAB — CBC WITH DIFFERENTIAL/PLATELET
BASOS ABS: 0 10*3/uL (ref 0.0–0.1)
Basophils Relative: 1 % (ref 0–1)
Eosinophils Absolute: 0.5 10*3/uL (ref 0.0–0.7)
Eosinophils Relative: 12 % — ABNORMAL HIGH (ref 0–5)
HCT: 30.6 % — ABNORMAL LOW (ref 39.0–52.0)
Hemoglobin: 10.4 g/dL — ABNORMAL LOW (ref 13.0–17.0)
LYMPHS PCT: 14 % (ref 12–46)
Lymphs Abs: 0.6 10*3/uL — ABNORMAL LOW (ref 0.7–4.0)
MCH: 34.1 pg — ABNORMAL HIGH (ref 26.0–34.0)
MCHC: 34 g/dL (ref 30.0–36.0)
MCV: 100.3 fL — ABNORMAL HIGH (ref 78.0–100.0)
Monocytes Absolute: 0.3 10*3/uL (ref 0.1–1.0)
Monocytes Relative: 6 % (ref 3–12)
Neutro Abs: 2.8 10*3/uL (ref 1.7–7.7)
Neutrophils Relative %: 67 % (ref 43–77)
PLATELETS: 75 10*3/uL — AB (ref 150–400)
RBC: 3.05 MIL/uL — ABNORMAL LOW (ref 4.22–5.81)
RDW: 13.3 % (ref 11.5–15.5)
WBC: 4.1 10*3/uL (ref 4.0–10.5)

## 2014-08-27 LAB — COMPREHENSIVE METABOLIC PANEL
ALT: 23 U/L (ref 0–53)
AST: 29 U/L (ref 0–37)
Albumin: 3.5 g/dL (ref 3.5–5.2)
Alkaline Phosphatase: 77 U/L (ref 39–117)
Anion gap: 12 (ref 5–15)
BUN: 17 mg/dL (ref 6–23)
CALCIUM: 10.1 mg/dL (ref 8.4–10.5)
CO2: 25 meq/L (ref 19–32)
Chloride: 98 mEq/L (ref 96–112)
Creatinine, Ser: 1.79 mg/dL — ABNORMAL HIGH (ref 0.50–1.35)
GFR calc Af Amer: 39 mL/min — ABNORMAL LOW (ref >90)
GFR calc non Af Amer: 33 mL/min — ABNORMAL LOW (ref >90)
Glucose, Bld: 126 mg/dL — ABNORMAL HIGH (ref 70–99)
Potassium: 3.7 mEq/L (ref 3.7–5.3)
SODIUM: 135 meq/L — AB (ref 137–147)
Total Bilirubin: 0.7 mg/dL (ref 0.3–1.2)
Total Protein: 6.3 g/dL (ref 6.0–8.3)

## 2014-08-27 NOTE — Consult Note (Signed)
Clinical Social Worker has communicated with patient daughter over the phone and will initiate referral process to memory care units.  CSW has prepared patient daughter that she will likely need to take patient home and pursue long term Medicaid process.  Patient daughter states that she has tried in the past, however was told that she could not submit application because patient was not currently in a facility.  CSW explained that that is not the case and she can go apply anytime.  CSW spoke with [facility] and [place] who are agreeable with patient as Medicaid only and will notify Medicaid worker once assigned to try and expedite process of approval.  CSW has spoken with CM who states that patient is currently followed by [facility] and a social worker can be added.  CSW to updated patient daughter.  Barbette Or, [place]

## 2014-08-27 NOTE — Progress Notes (Addendum)
  CARE MANAGEMENT ED NOTE 08/27/2014  Patient:  Bryan Pham, Bryan Pham   Account Number:  [id number]  Date Initiated:  08/27/2014  Documentation initiated by:  Edwyna Shell  Subjective/Objective Assessment:   80 yo male presenting to the ED with daughter r/t concerns with increased confusion     Subjective/Objective Assessment Detail:     Action/Plan:   Patient will be discharged home with his [facility] services through [facility] already in place ad will be followed additionally by an [place] SW.   Action/Plan Detail:   Anticipated DC Date:       Status Recommendation to Physician:   Result of Recommendation:  Agreed    [place]  CM consult  Other    Choice offered to / List presented to:       [facility] arranged  [place]      [place].    Status of service:  Completed, signed off  ED Comments:   ED Comments Detail:  Edwyna Shell RN, BSN, CM 08/27/14 1:56pm SW consulted with this CM to assist with services in the home as the patient is unable to be placed in a facility from the ED and the patient is also refusing placement. This CM noted that [facility] initiated [facility] services with [facility] RN and PT upon hospital discharge on 9/27 therefore this CM contacted [place] for the addition of a [place] SW to the [facility] team to better serve the patient in the home and connect with services. This CM contacted the patient's daughter [name] and made her aware of the addition of the SW with [facility] services. The daughter verbalized the appreciation of this addition of this services and had no questions.  EDP updated with information provided and referral made. No further CM needs identified at this time   CM received a call from the patient's daughter, [name], requesting that discharge paper work be faxed to a facility. This CM stated that this information would be provided to the LCSW, [place]. This CM then requested the daughter's approximate time of arrival that she will  be picking up her father as he just asked this CM to call her and inquire about her ETA. The daughter confirmed that she will pick her father up between 3 and 4 today. This Cm then communicated the daughter request to LCSW and updated the patient as to his pick up time. Edwyna Shell, RN, BSN, Case Manager 08/27/2014 2:23 PM

## 2014-08-27 NOTE — ED Notes (Signed)
Carb modified & heart healthy diet breakfast tray ordered

## 2014-08-27 NOTE — ED Notes (Signed)
Secretary to contact Social work for consult

## 2014-08-27 NOTE — ED Notes (Signed)
[place], MD at bedside.  

## 2014-08-27 NOTE — ED Notes (Signed)
Daughter called and stated she would pick pt up by 1600.

## 2014-08-27 NOTE — ED Provider Notes (Addendum)
CSN: [id number]     Arrival date & time 08/27/14  0011 History   First MD Initiated Contact with Patient 08/27/14 0216     Chief Complaint  Patient presents with  . Dementia     (Consider location/radiation/quality/duration/timing/severity/associated sxs/prior Treatment) HPI 80 year old male presents to the emergency department from home accompanied by his daughter.  She is concerned that over the last several weeks to months he has had increasing confusion, hallucinations and paranoia.  Patient lives by himself, and is still driving.  Patient's daughter lives in [place], but has recently come back into town to care for her parents.  She has noted a significant decline since being admitted to the hospital last month.patient reports a few days ago he saw "spooks"in his apartment that were stealing her money that he had hidden.  Patient goes on to talk about his neighbors who are all against him and wish him ill.  He reports it stems to his personality and how he likes to talk to people, and his neighbors don't talk to him and are plotting to harm him.  Daughter reports he has talked about multiple visual hallucinations.  No SI/HI  Past Medical History  Diagnosis Date  . Arthritis   . Diabetes mellitus   . Hyperlipidemia   . Hypertension   . Leg swelling     left leg   . Abdominal pain 10/12    pt was having abdominal pain over whole abdominal area   . CAD (coronary artery disease)     multiple PCI to diag system in 1990; echo 08/09/11- EF 40-45%, mild AR and mild MR; myoview 10/22/2009-EF 53%, defect in the inferior region, no ischemai or scar  . Orthostatic hypotension 07/2012    29mmHg drop in SBP, lasix and atenolol was D/C  . Kidney disease     mild  . Family history of anesthesia complication     daughter has nausea  . GERD (gastroesophageal reflux disease)    Past Surgical History  Procedure Laterality Date  . Back surgery  2009  . Cholecystectomy  09/07/11  . Cardiac  catheterization  04/16/2003    EF >55%, significant HTN, mod disease at the distal portion of the LAD which is not amenable to PCI and a mod lesion in the prox portion of the OM 1, medical therapy  . Cardiac catheterization  06/11/1999    nl LV function, MVP, minor irregularities in the LAD and diag with no high grade stenosis  . Cardiac catheterization  06/14/1997    90% narrowing of the ostial portion of the first diag, no restenosis of prior PCI in LAD or OM 1, nl EF  . Coronary angioplasty  08/01/1996    2V CAD involving the distal LAD and OM1, angioplasty of distal LAD and the OM1   Family History  Problem Relation Age of Onset  . Cancer Sister     colon   History  Substance Use Topics  . Smoking status: Former Smoker    Quit date: 11/15/1973  . Smokeless tobacco: Never Used  . Alcohol Use: No    Review of Systems  Unable to perform ROS: Dementia      Allergies  Benadryl  Home Medications   Prior to Admission medications   Medication Sig Start Date End Date Taking? Authorizing Provider  aspirin 81 MG tablet Take 81 mg by mouth daily.    Yes Historical Provider, MD  atorvastatin (LIPITOR) 40 MG tablet Take 40 mg by mouth daily.  07/04/14  Yes Historical Provider, MD  calcitRIOL (ROCALTROL) 0.5 MCG capsule Take 0.5 mcg by mouth daily.   Yes Historical Provider, MD  carvedilol (COREG) 12.5 MG tablet Take 12.5 mg by mouth 2 (two) times daily with a meal.   Yes Historical Provider, MD  HYDROcodone-acetaminophen (NORCO/VICODIN) 5-325 MG per tablet Take 1 tablet by mouth 3 (three) times daily as needed (for pain).  07/18/14  Yes Historical Provider, MD  JANUVIA 50 MG tablet Take 1 tablet by mouth daily. 06/24/14  Yes Historical Provider, MD  levothyroxine (SYNTHROID, LEVOTHROID) 25 MCG tablet Take 25 mcg by mouth daily.     Yes Historical Provider, MD  LORazepam (ATIVAN) 0.5 MG tablet Take 0.5 mg by mouth at bedtime.  08/20/14  Yes Historical Provider, MD  losartan (COZAAR) 50 MG tablet  Take 1 tablet (50 mg total) by mouth daily. 08/10/14  Yes Domenic Polite, MD  omeprazole (PRILOSEC) 40 MG capsule Take 40 mg by mouth 2 (two) times daily.     Yes Historical Provider, MD   BP 155/68  Pulse 68  Temp(Src) 97.7 F (36.5 C) (Oral)  Resp 16  SpO2 99% Physical Exam  Nursing note and vitals reviewed. Constitutional: He is oriented to person, place, and time. He appears well-developed and well-nourished. No distress.  HENT:  Head: Normocephalic and atraumatic.  Nose: Nose normal.  Mouth/Throat: Oropharynx is clear and moist.  Eyes: Conjunctivae and EOM are normal. Pupils are equal, round, and reactive to light.  Neck: Normal range of motion. Neck supple. No JVD present. No tracheal deviation present. No thyromegaly present.  Cardiovascular: Normal rate, regular rhythm, normal heart sounds and intact distal pulses.  Exam reveals no gallop and no friction rub.   No murmur heard. Pulmonary/Chest: Effort normal and breath sounds normal. No stridor. No respiratory distress. He has no wheezes. He has no rales. He exhibits no tenderness.  Abdominal: Soft. Bowel sounds are normal. He exhibits no distension and no mass. There is no tenderness. There is no rebound and no guarding.  Musculoskeletal: Normal range of motion. He exhibits no edema and no tenderness.  Lymphadenopathy:    He has no cervical adenopathy.  Neurological: He is alert and oriented to person, place, and time. He displays normal reflexes. He exhibits normal muscle tone. Coordination normal.  Skin: Skin is warm and dry. No rash noted. He is not diaphoretic. No erythema. No pallor.  Psychiatric: He has a normal mood and affect. His behavior is normal.  Poor insight and judgment due to dementia    ED Course  Procedures (including critical care time) Labs Review Labs Reviewed  CBC WITH DIFFERENTIAL - Abnormal; Notable for the following:    RBC 3.05 (*)    Hemoglobin 10.4 (*)    HCT 30.6 (*)    MCV 100.3 (*)    MCH  34.1 (*)    Platelets 75 (*)    Lymphs Abs 0.6 (*)    Eosinophils Relative 12 (*)    All other components within normal limits  COMPREHENSIVE METABOLIC PANEL - Abnormal; Notable for the following:    Sodium 135 (*)    Glucose, Bld 126 (*)    Creatinine, Ser 1.79 (*)    GFR calc non Af Amer 33 (*)    GFR calc Af Amer 39 (*)    All other components within normal limits  URINALYSIS, ROUTINE W REFLEX MICROSCOPIC    Imaging Review No results found.   EKG Interpretation None  MDM   Final diagnoses:  Dementia, with behavioral disturbance    80 yo male with worsening dementia, not safe to live on his own.  Plan for care management/social work in am.   Kalman Drape, MD 08/27/14 [place], MD 09/10/14 [phone]

## 2014-08-27 NOTE — Consult Note (Signed)
Clinical Social Worker spoke with patient daughter over the phone.  Patient daughter is in the process of contacting family members in hopes of potentially having patient go to a Memory Care unit as private pay until Medicaid is in place.  CSW has placed referral to [facility] and will await response from patient daughter.  CSW provided patient daughter with cut off time of 15:00 today to attempt to make arrangements before home health services will be expanded and patient sent home.  Patient daughter in agreement and appreciative of time to work on situation with family.  CSW remains available and will update RN.  Barbette Or, [place]

## 2014-08-27 NOTE — Consult Note (Signed)
Clinical Social Worker spoke with patient at bedside who is currently alert and oriented x3 and refusing placement.  CSW spoke with patient daughter over the phone who is in agreement with private pay at a [facility] facility, however patient is not currently in agreement.  CSW has spoken with CM who plans to add a social worker to patient home health.  CSW has confirmed with patient daughter that she will provide patient with transportation.  Patient daughter expresses disappointment but understanding.  Patient daughter is now aware of the process to continue to pursue placement once appropriate and patient agreeable.    Clinical Social Worker will sign off for now as social work intervention is no longer needed. Please consult [place] again if new need arises.  Barbette Or, [place]

## 2014-08-27 NOTE — ED Notes (Signed)
Kyra Manges- Daughter of patient. Cell Number- [phone]

## 2014-08-27 NOTE — Discharge Instructions (Signed)
Dementia °Dementia is a word that is used to describe problems with the brain and how it works. People with dementia have memory loss. They may also have problems with thinking, speaking, or solving problems. It can affect how they act around people, how they do their job, their mood, and their personality. These changes may not show up for a long time. Family or friends may not notice problems in the early part of this disease. °HOME CARE °The following tips are for the person living with, or caring for, the person with dementia. °Make the home safe. °· Remove locks on bathroom doors. °· Use childproof locks on cabinets where alcohol, cleaning supplies, or chemicals are stored. °· Put outlet covers in electrical outlets. °· Put in childproof locks to keep doors and windows safe. °· Remove stove knobs, or put in safety knobs that shut off on their own. °· Lower the temperature on water heaters. °· Label medicines. Lock them in a safe place. °· Keep knives, lighters, matches, power tools, and guns out of reach or in a safe place. °· Remove objects that might break or can hurt the person. °· Make sure lighting is good inside and outside. °· Put in grab bars if needed. °· Use a device that detects falls or other needs for help. °Lessen confusion. °· Keep familiar objects and people around. °· Use night lights or low lit (dim) lights at night. °· Label objects or areas. °· Use reminders, notes, or directions for daily activities or tasks. °· Keep a simple routine that is the same for waking, meals, bathing, dressing, and bedtime. °· Create a calm and quiet home. °· Put up clocks and calendars. °· Keep emergency numbers and the home address near all phones. °· Help show the different times of day. Open the curtains during the day to let light in. °Speak clearly and directly. °· Choose simple words and short sentences. °· Use a gentle, calm voice. °· Do not interrupt. °· If the person has a hard time finding a word to  use, give them the word or thought. °· Ask 1 question at a time. Give enough time for the person to answer. Repeat the question if the person does not answer. °Do things that lessen restlessness. °· Provide a comfortable bed. °· Have the same bedtime routine every night. °· Have a regular walking and activity schedule. °· Lessen naps during the day. °· Do not let the person drink a lot of caffeine. °· Go to events that are not overwhelming. °Eat well and drink fluids. °· Lessen distractions during meal times and snacks. °· Avoid foods that are too hot or too cold. °· Watch how the person chews and swallows. This is to make sure they do not choke. °Other °· Keep all vision, hearing, dental, and medical visits with the doctor. °· Only give medicines as told by the doctor. °· Watch the person's driving ability. Do not let the person drive if he or she cannot drive safely. °· Use a program that helps find a person if they become missing. You may need to register with this program. °GET HELP RIGHT AWAY IF:  °· A fever of 102° F (38.9° C) develops. °· Confusion develops or gets worse. °· Sleepiness develops or gets worse. °· Staying awake is hard to do. °· New behavior problems start like mood swings, aggression, and seeing things that are not there. °· Problems with balance, speech, or falling develop. °· Problems swallowing develop. °· Any   problems of another sickness develop. MAKE SURE YOU:  Understand these instructions.  Will watch his or her condition.  Will get help right away if he or she is not doing well or gets worse. Document Released: 10/14/2008 Document Revised: 01/24/2012 Document Reviewed: 03/29/2011 [facility] Patient Information 2015 [place], [place]. This information is not intended to replace advice given to you by your health care provider. Make sure you discuss any questions you have with your health care provider.   Emergency Department Resource Guide 1) Find a Doctor and Pay Out of  Pocket Although you won't have to find out who is covered by your insurance plan, it is a good idea to ask around and get recommendations. You will then need to call the office and see if the doctor you have chosen will accept you as a new patient and what types of options they offer for patients who are self-pay. Some doctors offer discounts or will set up payment plans for their patients who do not have insurance, but you will need to ask so you aren't surprised when you get to your appointment.  2) Contact Your Local Health Department Not all health departments have doctors that can see patients for sick visits, but many do, so it is worth a call to see if yours does. If you don't know where your local health department is, you can check in your phone book. The CDC also has a tool to help you locate your state's health department, and many state websites also have listings of all of their local health departments.  3) Find a [place] [facility] If your illness is not likely to be very severe or complicated, you may want to try a walk in clinic. These are popping up all over the country in pharmacies, drugstores, and shopping centers. They're usually staffed by nurse practitioners or physician assistants that have been trained to treat common illnesses and complaints. They're usually fairly quick and inexpensive. However, if you have serious medical issues or chronic medical problems, these are probably not your best option.  No Primary Care Doctor: - Call Health Connect at  [phone] - they can help you locate a primary care doctor that  accepts your insurance, provides certain services, etc. - Physician Referral Service- [phone]  Chronic Pain Problems: Organization         Address  Phone   Notes  [facility]  [phone] Patients need to be referred by their primary care doctor.   Medication Assistance: Organization         Address  Phone   Notes  [facility]  Medication [facility] [place] [place], [place] [address] [phone] --Must be a resident of [facility] -- Must have NO insurance coverage whatsoever (no Medicaid/ Medicare, etc.) -- The pt. MUST have a primary care doctor that directs their care regularly and follows them in the community   MedAssist  [phone]   [employer]  [phone]    Agencies that provide inexpensive medical care: Organization         Address  Phone   Notes  [place]  [phone]   [facility]    [phone]   [facility] [place], [place] [address] [phone]   [place] [address] [place] [phone]   Planned Parenthood    [phone]   [facility]    [phone]   [place] [address], [place] Phone:  [phone], Fax:  [phone] Hours of Operation:  9 am - 6 pm, M-F.  Also accepts Medicaid/Medicare and self-pay.  [facility] for [address], [address], [place] Phone: [phone], Fax: [phone]. Hours of Operation:  8:30 am - 5:30 pm, M-F.  Also accepts Medicaid and self-pay.  [facility] [place] [address], [place] Phone: [phone]   [place], [place], [place] [phone], Ext. [phone] Mondays & Thursdays: 7-9 AM.  First 15 patients are seen on a first come, first serve basis.    [place] Providers:  Organization         Address  Phone   Notes  [facility] [address], [place] [phone] Also accepts self-pay patients.  [facility] [address], [place]  [phone]   [place], [address], [place] [phone]   [facility] [address], [place] [phone]   Lucianne Lei [address], [place]   [phone] Only accepts [place] Access [place] patients after they have their name applied to their card.   Self-Pay (no insurance) in [facility]:  Organization         Address  Phone   Notes  Sickle Cell Patients, [facility] Internal [facility] [place] [phone]   [facility] [place] [phone]   [facility] [place]  [place], [place], [place] [phone]   [facility]/Dr. Osei-Bonsu  [address], [place] or [address], [place] [phone] Phone number for both [place] and [place] locations is the same.  [facility] [address], [place] [phone]   [facility] [address] [place] or [address] [phone]   [facility] [address] [place] [phone], phone; [phone], fax Sees patients 1st and 3rd Saturday of every month.  Must not qualify for public or private insurance (i.e. Medicaid, Medicare, [place] Health Choice, Veterans' Benefits)  Household income should be no more than 200% of the poverty level The clinic cannot treat you if you are pregnant or think you are pregnant  Sexually transmitted diseases are not treated at the clinic.    Dental Care: Organization         Address  Phone  Notes  [facility] of [facility] [place] [phone] Accepts children up to age 89 who are enrolled in [place] or [place]; pregnant women with a Medicaid card; and children who have applied for Medicaid or [place] Health Choice, but were declined, whose parents can pay a reduced fee at time of service.  [facility] of [facility]  [address], [place]  [phone] Accepts children up to age 65 who are enrolled in [place] or [place]; pregnant women with a Medicaid card; and children who have applied for Medicaid or [place] Health Choice, but were declined, whose parents can pay a reduced fee at time of service.  [place]  Access PROGRAM  [place] [phone] Patients are seen by appointment only. Walk-ins are not accepted. [place] will see patients 16 years of age and older. Monday - Tuesday (8am-5pm) Most Wednesdays (8:30-5pm) $30 per visit, cash only  [facility] Adult Dental Access PROGRAM  [address], [facility] [phone] Patients are seen by appointment only. Walk-ins are not accepted. [place] will see patients 36 years of age and older. One Wednesday Evening (Monthly: Volunteer Based).  $30 per visit, cash only  [place]  [phone] for adults; Children under age 32, call Graduate Pediatric Dentistry at [phone]. Children aged 60-14, please call [phone] to request a pediatric application.  Dental services are provided in all areas of dental care including fillings, crowns and bridges, complete and partial dentures, implants, gum treatment, root canals, and extractions. Preventive care is also provided. Treatment is provided to both adults and children. Patients are selected via a lottery and there is often a waiting list.   [facility] [address] [place]  [phone] [email or web address]   [facility] [address] [place], [place] [phone], Ext. [phone] Second and Fourth Thursday of each month, opens at 6:30 AM; Clinic ends at 9 AM.  Patients are seen on a first-come first-served basis, and a limited number are seen during each clinic.   [facility]  [address] Hillard Danker [place], [place] [phone]   Eligibility Requirements You must have lived in [place], [place], or [place]  counties for at least the last three months.   You cannot be eligible for state or federal sponsored [employer], including [employer], [place], or [employer].   You generally cannot be eligible for healthcare insurance through your employer.    How to apply: Eligibility screenings are held every Tuesday and Wednesday afternoon from 1:00 pm until 4:00 pm. You do not need an appointment for the interview!  [facility] [address] [place], [place]   [place]  [place] Department  [place]  [phone]    Behavioral Health Resources in the Community: Intensive Outpatient Programs Organization         Address  Phone  Notes  [place] [address], [place], [place] [phone]   [facility] Outpatient [address], [place], [place]   ADS: Alcohol & Drug Svcs [address], [place], [place]   [place] [address],  [place], [place] or [phone]   Substance Abuse Resources Organization         Address  Phone  Notes  Alcohol and Drug Services  [phone]   [place]  [phone]   [facility] [place]   Chinita Pester  [phone]   Residential & Outpatient Substance Abuse Program  [phone]   Psychological Services Organization         Address  Phone  Notes  [facility] [place]  [place]  [phone]   [place] [address], [place] or [phone]    Mobile Crisis Teams Organization         Address  Phone  Notes  [facility], Mobile Crisis Care Unit  [phone]   [facility]  [address] [place], [place]  [place], [place] [phone]    Self-Help/Support Groups Organization         Address  Phone             Notes  [facility] [place] - variety of support groups  [place] Call for more information  Narcotics Anonymous (NA), [facility] [address], [employer] [place]  2 meetings at this location   Special educational needs teacher         Address  Phone  Notes  [facility] Residential Treatment [place],    [place]  [phone]   [facility]  [address] [place] [phone], [place], [place]   [place], [place] [phone] Admissions: 8am-3pm M-F  Incentives Substance [place] [address]    [place], [place] [phone]   [facility] [address] [place], [place], [place]   [facility] [address],  [place], [place]   [facility] - Intensive Outpatient [address], [place], [place]   [facility] ([place].) [place].,  [place], [place] [phone] or [phone]   [facility] Services (RTS) [address] [place], [place] Accepts Medicaid  [facility] [place] [address]  [place] [phone] Substance Abuse/Addiction Treatment   [facility] Organization         Address  Phone  Notes  [facility]  [phone]   Domenic Schwab, PhD [address] Arlis [place], [place]   [phone] or [phone]   [facility] [place], [place] [phone]   [facility] [address] [place], [place] [phone] Insurance/Medicaid/sponsorship through [facility] and Families [address] [place]                                    [place], [place] [phone] [place] [address], [place] [phone]    Dr. Adele Schilder  [phone]   [facility] [place] Dept. 1) [address], [place] 2) [place] 3)  [place] [address], [place] [phone]  [phone]   [place] [phone] or [phone] (After Hours)

## 2014-08-27 NOTE — ED Notes (Signed)
Daughter reports  progressing dementia /confusion/disoriented for the past several days , denies pain or discomfort /respirations unlabored , history of Alcoholic Dementia.

## 2014-08-27 NOTE — ED Notes (Addendum)
Pt has been having visual hallucinations. He lives alone, sts "the people on my hallway don't like me." Daughter reports on 4 different occassions the patient has been seeing people who he believes wants to hurt him or has hurt his daughter. Pt has hx of dementia.  Per daughter, pt has had episodes where he thinks she is in the apartment when she isn't/ . Also , had one incident in which he thought "things" in his apartment killed his daughter. When asked if in pain, patient replied "Yeah, I'm hurting. I'm hurting because I need some liquor. It's been 4 months and no one will give me any liquor." Pt denies auditory hallucinations; is A&Ox4.

## 2014-08-27 NOTE — ED Notes (Signed)
Pt given sandwich and diet coke 

## 2014-09-19 ENCOUNTER — Ambulatory Visit: Payer: Commercial Managed Care - HMO | Admitting: Neurology

## 2014-09-26 ENCOUNTER — Ambulatory Visit (INDEPENDENT_AMBULATORY_CARE_PROVIDER_SITE_OTHER): Payer: Commercial Managed Care - HMO | Admitting: Neurology

## 2014-09-26 ENCOUNTER — Encounter: Payer: Self-pay | Admitting: Neurology

## 2014-09-26 VITALS — BP 132/70 | HR 60 | Ht 70.0 in | Wt 145.0 lb

## 2014-09-26 DIAGNOSIS — F0390 Unspecified dementia without behavioral disturbance: Secondary | ICD-10-CM

## 2014-09-26 NOTE — Progress Notes (Signed)
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  Records, hospital records, labs and CT personally reviewed.  Mr. Rosamond has a longstanding history of alcohol abuse.  He quit drinking cold [place] in July. He had a somewhat decline afterwards.  In September, he passed out.  CT Head without contrast was performed on 08/08/14 and was personally reviewed, showing atrophy and small vessel ischemic changes. It was determined that he had orthostatic hypotension due to diuretic.  Hollowing this incident, he began having hallucinations.  On a couple of occasions, he reported seeing "spooks" in his house.  One time, he called the police with this concern.  He has not been agitated or combative.  Another time, he went outside looking for his daughter who was not even around at the time.  His neighbor once found him standing in the hallway of the apartment building in the dark.  Prior to this, he did exhibit short-term memory problems.  Overall, he has remained quite independent.  He has lived alone in the same apartment complex for 20 years.  He manages his own finances.  He is able to perform all of his ADLs.  He has devised a system to help him remember to take his medications.  He now writes things down as well.  He does have trouble using electrical devices.  He sometimes has trouble operating the TV. He had trouble with the phone and it turned out to be broken.  He is now unsure how to use the circuit breaker if the electricity goes out.  He has somebody come and help clean the apartment and wash the  laundry.  He does still drive.  He reports that his appetite is variable.  He sleeps well.  He denies depression.  Mood has been good.  His children do not note any change in personality or behavior.  His daughter, who just recently moved from [place], was concerned about his decline.  There is no known family history of dementia.  Labs from 08/09/14 include TSH 4.410, B12 470, and folate 781. Labs from 08/27/14 showed WBC 4.1, HGB 10.4, HCT 30.6, PLT 75, MCV 100.3, Na 135, K 3.7, glucose 126, BUN 17, Cr 1.79, TP 6.3, ALP 77, AST 29, ALT 23.  His medications include ASA 81mg , Lipitor 40mg  calcitriol 0.71mcg, Coreg 12.5 BID, Norco as needed for pain, Januvia 50mg , levothyroxine 25 mcg, lorazepam 0.5mg , Cozaar 50mg , and omeprazole 40mg .  PAST MEDICAL HISTORY: Past Medical History  Diagnosis Date  . Arthritis   . Diabetes mellitus   . Hyperlipidemia   . Hypertension   . Leg swelling     left leg   . Abdominal pain 10/12    pt was having abdominal pain over whole abdominal area   . CAD (coronary artery disease)     multiple PCI to diag system in 1990; echo 08/09/11- EF 40-45%, mild AR and mild MR; myoview 10/22/2009-EF 53%, defect in the inferior region, no ischemai or scar  . Orthostatic hypotension 07/2012    33mmHg drop in SBP, lasix and atenolol was D/C  . Kidney disease     mild  . Family history of anesthesia  complication     daughter has nausea  . GERD (gastroesophageal reflux disease)     PAST SURGICAL HISTORY: Past Surgical History  Procedure Laterality Date  . Back surgery  2009  . Cholecystectomy  09/07/11  . Cardiac catheterization  04/16/2003    EF >55%, significant HTN, mod disease at the distal portion of the LAD which is not amenable to PCI and a mod lesion in the prox portion of the OM 1, medical therapy  . Cardiac catheterization  06/11/1999    nl LV function, MVP, minor irregularities in the LAD and diag with no high grade stenosis  . Cardiac catheterization  06/14/1997     90% narrowing of the ostial portion of the first diag, no restenosis of prior PCI in LAD or OM 1, nl EF  . Coronary angioplasty  08/01/1996    2V CAD involving the distal LAD and OM1, angioplasty of distal LAD and the OM1    MEDICATIONS: Current Outpatient Prescriptions on File Prior to Visit  Medication Sig Dispense Refill  . aspirin 81 MG tablet Take 81 mg by mouth daily.     Marland Kitchen atorvastatin (LIPITOR) 40 MG tablet Take 40 mg by mouth daily.    . calcitRIOL (ROCALTROL) 0.5 MCG capsule Take 0.5 mcg by mouth daily.    . carvedilol (COREG) 12.5 MG tablet Take 12.5 mg by mouth 2 (two) times daily with a meal.    . HYDROcodone-acetaminophen (NORCO/VICODIN) 5-325 MG per tablet Take 1 tablet by mouth 3 (three) times daily as needed (for pain).     Marland Kitchen JANUVIA 50 MG tablet Take 1 tablet by mouth daily.    Marland Kitchen LORazepam (ATIVAN) 0.5 MG tablet Take 0.5 mg by mouth at bedtime.     Marland Kitchen losartan (COZAAR) 50 MG tablet Take 1 tablet (50 mg total) by mouth daily. 30 tablet 0  . omeprazole (PRILOSEC) 40 MG capsule Take 40 mg by mouth 2 (two) times daily.      Marland Kitchen levothyroxine (SYNTHROID, LEVOTHROID) 25 MCG tablet Take 25 mcg by mouth daily.       No current facility-administered medications on file prior to visit.    ALLERGIES: Allergies  Allergen Reactions  . Benadryl [Diphenhydramine Hcl]     shaking    FAMILY HISTORY: Family History  Problem Relation Age of Onset  . Cancer Sister     colon    SOCIAL HISTORY: History   Social History  . Marital Status: Divorced    Spouse Name: N/A    Number of Children: N/A  . Years of Education: N/A   Occupational History  . Not on file.   Social History Main Topics  . Smoking status: Former Smoker    Quit date: 11/16/1967  . Smokeless tobacco: Never Used  . Alcohol Use: No  . Drug Use: No  . Sexual Activity: Not on file   Other Topics Concern  . Not on file   Social History Narrative    REVIEW OF SYSTEMS: Constitutional: No fevers, chills,  or sweats, no generalized fatigue, change in appetite Eyes: No visual changes, double vision, eye pain Ear, nose and throat: No hearing loss, ear pain, nasal congestion, sore throat Cardiovascular: No chest pain, palpitations Respiratory:  No shortness of breath at rest or with exertion, wheezes GastrointestinaI: No nausea, vomiting, diarrhea, abdominal pain, fecal incontinence Genitourinary:  No dysuria, urinary retention or frequency Musculoskeletal:  No neck pain, back pain Integumentary: No rash, pruritus, skin lesions Neurological: as above Psychiatric: No depression,  insomnia, anxiety Endocrine: No palpitations, fatigue, diaphoresis, mood swings, change in appetite, change in weight, increased thirst Hematologic/Lymphatic:  No anemia, purpura, petechiae. Allergic/Immunologic: no itchy/runny eyes, nasal congestion, recent allergic reactions, rashes  PHYSICAL EXAM: Filed Vitals:   09/26/14 1340  BP: 132/70  Pulse: 60   General: No acute distress Head:  Normocephalic/atraumatic Eyes:  fundi not able to visualize CN III, IV, VI:  full range of motion, no nystagmus, no ptosis Neck: supple, no paraspinal tenderness, full range of motion Back: No paraspinal tenderness Heart: regular rate and rhythm Lungs: Clear to auscultation bilaterally. Vascular: No carotid bruits. Neurological Exam: Mental status: alert and oriented to person, place, and time, delayed recall poor, remote memory intact, fund of knowledge intact, attention and concentration intact, speech fluent and not dysarthric, language intact. Montreal Cognitive Assessment  09/26/2014  Visuospatial/ Executive (0/5) 1  Naming (0/3) 3  Attention: Read list of digits (0/2) 2  Attention: Read list of letters (0/1) 1  Attention: Serial 7 subtraction starting at 100 (0/3) 2  Language: Repeat phrase (0/2) 2  Language : Fluency (0/1) 0  Abstraction (0/2) 2  Delayed Recall (0/5) 0  Orientation (0/6) 5  Total 18  Adjusted  Score (based on education) 19   Cranial nerves: CN I: not tested CN II: pupils equal, round and reactive to light, visual fields intact, fundi not able to visualize CN III, IV, VI:  full range of motion, no nystagmus, no ptosis CN V: facial sensation intact CN VII: upper and lower face symmetric CN VIII: hearing intact CN IX, X: gag intact, uvula midline CN XI: sternocleidomastoid and trapezius muscles intact CN XII: tongue midline Bulk & Tone: normal, no fasciculations. Motor:  5/5 throughout Sensation:  Temperature intact.  Reduced vibration in toes. Deep Tendon Reflexes:  2+ throughout except absent in ankles.  Toes downgoing. Finger to nose testing:  No dysmetria Gait:  Normal station and stride. Romberg negative.  IMPRESSION: Dementia.  Suspect alcohol-related, as he exhibits global deficits, including executive dysfunction as well as psychosis.  PLAN: 1.  To be sure we are not missing a neurodegenerative disease, will refer for neuropsychological testing. 2.  Will get home safety evaluation.  He is independent and I suspect that an assisted living facility may be appropriate. 3.  I recommend no driving. 4.  Will check thiamine level. 5.  At this point, he is not interested in starting any new medications. 6.  Follow up after testing.  Thank you for allowing me to take part in the care of this patient.  Metta Clines, DO  CC:  Merrilee Seashore, MD

## 2014-09-26 NOTE — Patient Instructions (Signed)
1.  I recommend no driving. 2.  We will refer you for neuropsychological testing at [facility]. 3.  We will get a safety evaluation  4.  Follow up after testing.

## 2014-09-29 LAB — VITAMIN B1: VITAMIN B1 (THIAMINE): 10 nmol/L (ref 8–30)

## 2014-09-30 ENCOUNTER — Encounter: Payer: Self-pay | Admitting: Internal Medicine

## 2014-09-30 ENCOUNTER — Telehealth: Payer: Self-pay | Admitting: *Deleted

## 2014-09-30 ENCOUNTER — Ambulatory Visit (INDEPENDENT_AMBULATORY_CARE_PROVIDER_SITE_OTHER): Payer: Commercial Managed Care - HMO | Admitting: Internal Medicine

## 2014-09-30 VITALS — BP 142/60 | HR 60 | Ht 70.0 in | Wt 141.7 lb

## 2014-09-30 DIAGNOSIS — N183 Chronic kidney disease, stage 3 unspecified: Secondary | ICD-10-CM

## 2014-09-30 DIAGNOSIS — I10 Essential (primary) hypertension: Secondary | ICD-10-CM

## 2014-09-30 DIAGNOSIS — E785 Hyperlipidemia, unspecified: Secondary | ICD-10-CM

## 2014-09-30 DIAGNOSIS — I951 Orthostatic hypotension: Secondary | ICD-10-CM

## 2014-09-30 DIAGNOSIS — I2510 Atherosclerotic heart disease of native coronary artery without angina pectoris: Secondary | ICD-10-CM

## 2014-09-30 DIAGNOSIS — I426 Alcoholic cardiomyopathy: Secondary | ICD-10-CM

## 2014-09-30 NOTE — Patient Instructions (Signed)
Your physician wants you to follow-up in: 6 months. You will receive a reminder letter in the mail two months in advance. If you don't receive a letter, please call our office to schedule the follow-up appointment.  Dr. Debara Pickett recommends compression stockings - thigh-high 20-43mmHg  Please continue the medications you were taking at hospital discharge

## 2014-09-30 NOTE — Telephone Encounter (Signed)
patient is aware of lab results

## 2014-09-30 NOTE — Progress Notes (Signed)
OFFICE NOTE  Chief Complaint:  Routine followup  Primary Care Physician: Merrilee Seashore, MD  HPI:  Bryan Pham  is an 80 year old gentleman with a history of coronary disease and a number of PCIs. Recently he had been having problems with orthostatic hypotension and his Lasix and atenolol were stopped and he has had marked improvement in those symptoms. He still gets a little dizzy when he gets up to go to urinate at night, probably because he springs out of bed. I told him it is important for him to sit up and then stand up and allow at least a minute or so equilibrate the blood pressures. There are probably still some signs of orthostasis. He was noted to have bradycardia recently was taken off of his atenolol. Currently is on no blood pressure medications. His blood pressure is appropriate today however is reported some days he does shoot up into the 093 systolic. Unfortunately, he recently was diagnosed with skin cancer. He's undergone multiple surgical procedures for this. It is unclear what the prognosis is.   Bryan Pham was referred back today for evaluation of an abnormal echocardiogram. He denies any symptoms such as shortness of breath but recently underwent an echocardiogram which demonstrated a new cardiomyopathy. His EF was 30-35% with global hypokinesis. Again he denies any chest pain or shortness of breath however has been and continues to use alcohol very heavily. In addition he has known coronary artery disease by cardiac catheterization in 2004. It is difficult to know whether he is new cardiomyopathy is due to heavy alcohol use or possibly multivessel coronary artery disease.  Bryan Pham returns today for followup. He reports that he has stopped drinking Now for 4 months which I commended him on. He reports a probably taking his medications however cannot tell me which ones he takes. His son is here to help corroborate the story. Unfortunately, he was recently  admitted for orthostatic hypotension and syncope. Medications were slightly adjusted and he followed up with a neurologist. It is thought that most of his symptoms are related to long-standing alcohol use and probable autonomic neuropathy.  PMHx:  Past Medical History  Diagnosis Date  . Arthritis   . Diabetes mellitus   . Hyperlipidemia   . Hypertension   . Leg swelling     left leg   . Abdominal pain 10/12    pt was having abdominal pain over whole abdominal area   . CAD (coronary artery disease)     multiple PCI to diag system in 1990; echo 08/09/11- EF 40-45%, mild AR and mild MR; myoview 10/22/2009-EF 53%, defect in the inferior region, no ischemai or scar  . Orthostatic hypotension 07/2012    79mmHg drop in SBP, lasix and atenolol was D/C  . Kidney disease     mild  . Family history of anesthesia complication     daughter has nausea  . GERD (gastroesophageal reflux disease)     Past Surgical History  Procedure Laterality Date  . Back surgery  2009  . Cholecystectomy  09/07/11  . Cardiac catheterization  04/16/2003    EF >55%, significant HTN, mod disease at the distal portion of the LAD which is not amenable to PCI and a mod lesion in the prox portion of the OM 1, medical therapy  . Cardiac catheterization  06/11/1999    nl LV function, MVP, minor irregularities in the LAD and diag with no high grade stenosis  . Cardiac catheterization  06/14/1997  90% narrowing of the ostial portion of the first diag, no restenosis of prior PCI in LAD or OM 1, nl EF  . Coronary angioplasty  08/01/1996    2V CAD involving the distal LAD and OM1, angioplasty of distal LAD and the OM1    FAMHx:  Family History  Problem Relation Age of Onset  . Cancer Sister     colon    SOCHx:   reports that he quit smoking about 46 years ago. He has never used smokeless tobacco. He reports that he does not drink alcohol or use illicit drugs.  ALLERGIES:  Allergies  Allergen Reactions  . Benadryl  [Diphenhydramine Hcl]     shaking    ROS: A comprehensive review of systems was negative except for: Constitutional: positive for weight loss Neurological: positive for coordination problems and syncope  HOME MEDS: Current Outpatient Prescriptions  Medication Sig Dispense Refill  . aspirin 81 MG tablet Take 81 mg by mouth daily.     Marland Kitchen atorvastatin (LIPITOR) 40 MG tablet Take 40 mg by mouth daily.    . calcitRIOL (ROCALTROL) 0.5 MCG capsule Take 0.5 mcg by mouth daily.    . carvedilol (COREG) 3.125 MG tablet Take 3.125 mg by mouth 2 (two) times daily with a meal.    . HYDROcodone-acetaminophen (NORCO/VICODIN) 5-325 MG per tablet Take 1 tablet by mouth 3 (three) times daily as needed (for pain).     Marland Kitchen JANUVIA 50 MG tablet Take 1 tablet by mouth daily.    Marland Kitchen levothyroxine (SYNTHROID, LEVOTHROID) 25 MCG tablet Take 25 mcg by mouth daily.      Marland Kitchen LORazepam (ATIVAN) 0.5 MG tablet Take 0.5 mg by mouth at bedtime.     Marland Kitchen losartan (COZAAR) 50 MG tablet Take 1 tablet (50 mg total) by mouth daily. 30 tablet 0  . omeprazole (PRILOSEC) 40 MG capsule Take 40 mg by mouth 2 (two) times daily.       No current facility-administered medications for this visit.    LABS/IMAGING: No results found for this or any previous visit (from the past 48 hour(s)). No results found.  VITALS: BP 142/60 mmHg  Pulse 60  Ht 5\' 10"  (1.778 m)  Wt 141 lb 11.2 oz (64.275 kg)  BMI 20.33 kg/m2  EXAM: General appearance: alert and no distress Neck: no adenopathy, no carotid bruit, no JVD, supple, symmetrical, trachea midline and thyroid not enlarged, symmetric, no tenderness/mass/nodules Lungs: clear to auscultation bilaterally Heart: regular rate and rhythm, S1, S2 normal, 2/6 SEM at apex, no click, rub or gallop Abdomen: soft, non-tender; bowel sounds normal; no masses,  no organomegaly Extremities: 1+ edema in the left lower extremity Pulses: 2+ and symmetric Skin: multiple skin lesions, bandages from recent  operations Neurologic: Grossly normal  EKG: Normal sinus rhythm at 60,  LVH with repolarization abnormality  ASSESSMENT: 1. 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 Coronary artery disease without angina 3. Orthostatic hypotension-continues to be symptomatic with a syncopal episode recently 4. Hyperlipidemia 5. CKD III 6. Skin cancer 7. Weight loss 8. HTN - improved 9. History of alcohol abuse-abstinent now for 4 months  PLAN: 1.   Bryan Pham has had some improvement in his blood pressures on medications for heart failure, but has struggled as well with orthostatic hypotension and a recent episode of syncope. This is probably due to autonomic neuropathy related to long-standing alcohol use. He has committed to be abstinent now for about 4 months. I have recommended that he purchase 20-30 mm  thigh-high compression stockings and use them to help mitigate his orthostatic hypotension. He may also benefit from abdominal binders. He will need repeat echocardiography probably at his next follow-up appointment to see if there's been any interim improvement in his ejection fraction. If it is not improved and he continues to be compliant with medications and alcohol abstinence, he may be a candidate for AICD placement. Unfortunately his renal function may preclude repeat cardiac catheterization. Plan to see him back in 6 months.  Pixie Casino, MD, [facility] Attending Cardiologist [facility] 09/30/2014, 12:50 PM

## 2014-09-30 NOTE — Telephone Encounter (Signed)
-----   Message from Dudley Major, DO sent at 09/30/2014  6:44 AM EST ----- B1 level is normal ----- Message -----    From: [facility] in [facility] Interface    Sent: 09/29/2014   3:14 PM      To: Dudley Major, DO

## 2014-10-02 ENCOUNTER — Ambulatory Visit: Payer: Commercial Managed Care - HMO | Admitting: Neurology

## 2014-10-04 ENCOUNTER — Telehealth: Payer: Self-pay | Admitting: Neurology

## 2014-10-04 NOTE — Telephone Encounter (Signed)
Daughter is aware that  [facility] does not take this type of insurance I have spoke with [facility] [place] and [employer]  None that this patient's  Insurance plan

## 2014-10-04 NOTE — Telephone Encounter (Signed)
[name], Pt's daughter called wanting to discus a questions regarding pt's insurance. Please call her # [phone]

## 2014-10-04 NOTE — Telephone Encounter (Signed)
[facility] patient.  

## 2014-10-04 NOTE — Telephone Encounter (Signed)
Please call daughter, [name] back. She says pt has not heard about a test we are supposed to schedule. Please call back at [phone] / Sherri S>

## 2014-10-28 ENCOUNTER — Other Ambulatory Visit: Payer: Self-pay | Admitting: Dermatology

## 2014-11-27 ENCOUNTER — Telehealth: Payer: Self-pay | Admitting: *Deleted

## 2014-11-27 ENCOUNTER — Telehealth: Payer: Self-pay | Admitting: Neurology

## 2014-11-27 NOTE — Telephone Encounter (Signed)
[name], pt's daughter would to speak to a nurse regarding setting up pt to have a second opinion regarding pt's dementia. C/b [phone]

## 2014-11-27 NOTE — Telephone Encounter (Signed)
Patients daughter states patient now has new insurance [employer]  and they would like to move forward with testing I advised her to bring card in so we scan and I will get him scheduled with Dr Valentina Shaggy

## 2014-11-29 ENCOUNTER — Telehealth: Payer: Self-pay | Admitting: *Deleted

## 2014-11-29 ENCOUNTER — Telehealth: Payer: Self-pay | Admitting: Neurology

## 2014-11-29 NOTE — Telephone Encounter (Signed)
[name], pt's daughter came by the office today to give [place] the pt's new [employer]. Pt also wanted to schedule a 2 hours evaluation appt that she had discussed with Susie. Please call the pt # [phone] to clarify what type of appt and with who.

## 2014-11-29 NOTE — Telephone Encounter (Signed)
Called patient and left message for him to call back.  Informed him that Daine Floras will be back on Monday.

## 2014-11-29 NOTE — Telephone Encounter (Signed)
Call [name]!!!

## 2014-12-10 DIAGNOSIS — Z08 Encounter for follow-up examination after completed treatment for malignant neoplasm: Secondary | ICD-10-CM | POA: Diagnosis not present

## 2014-12-10 DIAGNOSIS — L309 Dermatitis, unspecified: Secondary | ICD-10-CM | POA: Diagnosis not present

## 2014-12-10 DIAGNOSIS — Z85828 Personal history of other malignant neoplasm of skin: Secondary | ICD-10-CM | POA: Diagnosis not present

## 2014-12-23 DIAGNOSIS — N189 Chronic kidney disease, unspecified: Secondary | ICD-10-CM | POA: Diagnosis not present

## 2014-12-23 DIAGNOSIS — N2581 Secondary hyperparathyroidism of renal origin: Secondary | ICD-10-CM | POA: Diagnosis not present

## 2014-12-23 DIAGNOSIS — I129 Hypertensive chronic kidney disease with stage 1 through stage 4 chronic kidney disease, or unspecified chronic kidney disease: Secondary | ICD-10-CM | POA: Diagnosis not present

## 2014-12-23 DIAGNOSIS — D631 Anemia in chronic kidney disease: Secondary | ICD-10-CM | POA: Diagnosis not present

## 2014-12-23 DIAGNOSIS — N184 Chronic kidney disease, stage 4 (severe): Secondary | ICD-10-CM | POA: Diagnosis not present

## 2014-12-26 DIAGNOSIS — D509 Iron deficiency anemia, unspecified: Secondary | ICD-10-CM | POA: Diagnosis not present

## 2015-01-28 DIAGNOSIS — R296 Repeated falls: Secondary | ICD-10-CM | POA: Diagnosis not present

## 2015-01-28 DIAGNOSIS — I10 Essential (primary) hypertension: Secondary | ICD-10-CM | POA: Diagnosis not present

## 2015-01-28 DIAGNOSIS — R55 Syncope and collapse: Secondary | ICD-10-CM | POA: Diagnosis not present

## 2015-01-30 DIAGNOSIS — R296 Repeated falls: Secondary | ICD-10-CM | POA: Diagnosis not present

## 2015-01-30 DIAGNOSIS — R55 Syncope and collapse: Secondary | ICD-10-CM | POA: Diagnosis not present

## 2015-01-31 DIAGNOSIS — D649 Anemia, unspecified: Secondary | ICD-10-CM | POA: Diagnosis not present

## 2015-01-31 DIAGNOSIS — R55 Syncope and collapse: Secondary | ICD-10-CM | POA: Diagnosis not present

## 2015-01-31 DIAGNOSIS — R799 Abnormal finding of blood chemistry, unspecified: Secondary | ICD-10-CM | POA: Diagnosis not present

## 2015-01-31 DIAGNOSIS — N184 Chronic kidney disease, stage 4 (severe): Secondary | ICD-10-CM | POA: Diagnosis not present

## 2015-02-11 DIAGNOSIS — N184 Chronic kidney disease, stage 4 (severe): Secondary | ICD-10-CM | POA: Diagnosis not present

## 2015-02-11 DIAGNOSIS — E1165 Type 2 diabetes mellitus with hyperglycemia: Secondary | ICD-10-CM | POA: Diagnosis not present

## 2015-02-11 DIAGNOSIS — I10 Essential (primary) hypertension: Secondary | ICD-10-CM | POA: Diagnosis not present

## 2015-02-11 DIAGNOSIS — N189 Chronic kidney disease, unspecified: Secondary | ICD-10-CM | POA: Diagnosis not present

## 2015-03-24 DIAGNOSIS — R197 Diarrhea, unspecified: Secondary | ICD-10-CM | POA: Diagnosis not present

## 2015-03-24 DIAGNOSIS — R1031 Right lower quadrant pain: Secondary | ICD-10-CM | POA: Diagnosis not present

## 2015-03-24 DIAGNOSIS — K5901 Slow transit constipation: Secondary | ICD-10-CM | POA: Diagnosis not present

## 2015-04-08 ENCOUNTER — Encounter: Payer: Self-pay | Admitting: Internal Medicine

## 2015-04-08 ENCOUNTER — Ambulatory Visit (INDEPENDENT_AMBULATORY_CARE_PROVIDER_SITE_OTHER): Payer: Medicare Other | Admitting: Internal Medicine

## 2015-04-08 VITALS — BP 172/70 | HR 57 | Ht 69.0 in | Wt 154.0 lb

## 2015-04-08 DIAGNOSIS — N183 Chronic kidney disease, stage 3 unspecified: Secondary | ICD-10-CM

## 2015-04-08 DIAGNOSIS — I429 Cardiomyopathy, unspecified: Secondary | ICD-10-CM

## 2015-04-08 DIAGNOSIS — I10 Essential (primary) hypertension: Secondary | ICD-10-CM

## 2015-04-08 DIAGNOSIS — I951 Orthostatic hypotension: Secondary | ICD-10-CM

## 2015-04-08 DIAGNOSIS — I426 Alcoholic cardiomyopathy: Secondary | ICD-10-CM | POA: Diagnosis not present

## 2015-04-08 DIAGNOSIS — I428 Other cardiomyopathies: Secondary | ICD-10-CM

## 2015-04-08 NOTE — Patient Instructions (Signed)
Your physician has requested that you have an echocardiogram in 3 months. Echocardiography is a painless test that uses sound waves to create images of your heart. It provides your doctor with information about the size and shape of your heart and how well your heart's chambers and valves are working. This procedure takes approximately one hour. There are no restrictions for this procedure.  Your physician wants you to follow-up in: 6 months with Dr. Debara Pickett. You will receive a reminder letter in the mail two months in advance. If you don't receive a letter, please call our office to schedule the follow-up appointment.  Recheck of your BP 136/70

## 2015-04-08 NOTE — Progress Notes (Signed)
OFFICE NOTE  Chief Complaint:  Routine followup  Primary Care Physician: Merrilee Seashore, MD  HPI:  Bryan Pham  is an 81 year old gentleman with a history of coronary disease and a number of PCIs. Recently he had been having problems with orthostatic hypotension and his Lasix and atenolol were stopped and he has had marked improvement in those symptoms. He still gets a little dizzy when he gets up to go to urinate at night, probably because he springs out of bed. I told him it is important for him to sit up and then stand up and allow at least a minute or so equilibrate the blood pressures. There are probably still some signs of orthostasis. He was noted to have bradycardia recently was taken off of his atenolol. Currently is on no blood pressure medications. His blood pressure is appropriate today however is reported some days he does shoot up into the 419 systolic. Unfortunately, he recently was diagnosed with skin cancer. He's undergone multiple surgical procedures for this. It is unclear what the prognosis is.   Mr. Lindahl was referred back today for evaluation of an abnormal echocardiogram. He denies any symptoms such as shortness of breath but recently underwent an echocardiogram which demonstrated a new cardiomyopathy. His EF was 30-35% with global hypokinesis. Again he denies any chest pain or shortness of breath however has been and continues to use alcohol very heavily. In addition he has known coronary artery disease by cardiac catheterization in 2004. It is difficult to know whether he is new cardiomyopathy is due to heavy alcohol use or possibly multivessel coronary artery disease.  Mr. Lightner returns today for followup. He reports that he has stopped drinking Now for 4 months which I commended him on. He reports a probably taking his medications however cannot tell me which ones he takes. His son is here to help corroborate the story. Unfortunately, he was recently  admitted for orthostatic hypotension and syncope. Medications were slightly adjusted and he followed up with a neurologist. It is thought that most of his symptoms are related to long-standing alcohol use and probable autonomic neuropathy.  Mr. Hodes returns today for follow-up. Unfortunately he's had problems with orthostatic hypotension and worsening renal function and is currently off of a lot of his blood pressure and heart failure medications. His only medication for heart failure is carvedilol at a low dose of 3.125 mg twice daily. His echo last summer showed an EF of 30-35%. At that time he was using alcohol fairly regularly. He reports now that he's been abstinent for about 6 months and taking his medications regularly. He has had some more episodes of dizziness and orthostasis as well as syncope. I advised him to get compression stockings but that never occurred. We have discussed whether or not he is a candidate for AICD in the past, and it is now more likely if he has persistent low EF to consider this since he's not using alcohol. He denies any worsening shortness of breath or chest pain.  PMHx:  Past Medical History  Diagnosis Date  . Arthritis   . Diabetes mellitus   . Hyperlipidemia   . Hypertension   . Leg swelling     left leg   . Abdominal pain 10/12    pt was having abdominal pain over whole abdominal area   . CAD (coronary artery disease)     multiple PCI to diag system in 1990; echo 08/09/11- EF 40-45%, mild AR and mild MR; myoview 10/22/2009-EF  53%, defect in the inferior region, no ischemai or scar  . Orthostatic hypotension 07/2012    27mmHg drop in SBP, lasix and atenolol was D/C  . Kidney disease     mild  . Family history of anesthesia complication     daughter has nausea  . GERD (gastroesophageal reflux disease)     Past Surgical History  Procedure Laterality Date  . Back surgery  2009  . Cholecystectomy  09/07/11  . Cardiac catheterization  04/16/2003    EF  >55%, significant HTN, mod disease at the distal portion of the LAD which is not amenable to PCI and a mod lesion in the prox portion of the OM 1, medical therapy  . Cardiac catheterization  06/11/1999    nl LV function, MVP, minor irregularities in the LAD and diag with no high grade stenosis  . Cardiac catheterization  06/14/1997    90% narrowing of the ostial portion of the first diag, no restenosis of prior PCI in LAD or OM 1, nl EF  . Coronary angioplasty  08/01/1996    2V CAD involving the distal LAD and OM1, angioplasty of distal LAD and the OM1    FAMHx:  Family History  Problem Relation Age of Onset  . Cancer Sister     colon    SOCHx:   reports that he quit smoking about 47 years ago. He has never used smokeless tobacco. He reports that he does not drink alcohol or use illicit drugs.  ALLERGIES:  Allergies  Allergen Reactions  . Benadryl [Diphenhydramine Hcl]     shaking    ROS: A comprehensive review of systems was negative except for: Constitutional: positive for weight loss Neurological: positive for coordination problems and syncope  HOME MEDS: Current Outpatient Prescriptions  Medication Sig Dispense Refill  . aspirin 81 MG tablet Take 81 mg by mouth daily.     Marland Kitchen atorvastatin (LIPITOR) 40 MG tablet Take 40 mg by mouth daily.    . calcitRIOL (ROCALTROL) 0.5 MCG capsule Take 0.5 mcg by mouth daily.    . carvedilol (COREG) 3.125 MG tablet Take 3.125 mg by mouth 2 (two) times daily with a meal.    . HYDROcodone-acetaminophen (NORCO/VICODIN) 5-325 MG per tablet Take 1 tablet by mouth 3 (three) times daily as needed (for pain).     Marland Kitchen levothyroxine (SYNTHROID, LEVOTHROID) 25 MCG tablet Take 25 mcg by mouth daily.      Marland Kitchen omeprazole (PRILOSEC) 40 MG capsule Take 40 mg by mouth 2 (two) times daily.      . tamsulosin (FLOMAX) 0.4 MG CAPS capsule Take 0.4 mg by mouth daily after breakfast.     No current facility-administered medications for this visit.     LABS/IMAGING: No results found for this or any previous visit (from the past 48 hour(s)). No results found.  VITALS: BP 172/70 mmHg  Pulse 57  Ht 5\' 9"  (1.753 m)  Wt 154 lb (69.854 kg)  BMI 22.73 kg/m2  EXAM: General appearance: alert and no distress Neck: no adenopathy, no carotid bruit, no JVD, supple, symmetrical, trachea midline and thyroid not enlarged, symmetric, no tenderness/mass/nodules Lungs: clear to auscultation bilaterally Heart: regular rate and rhythm, S1, S2 normal, 2/6 SEM at apex, no click, rub or gallop Abdomen: soft, non-tender; bowel sounds normal; no masses,  no organomegaly Extremities: 1+ edema in the left lower extremity Pulses: 2+ and symmetric Skin: multiple skin lesions, bandages from recent operations Neurologic: Grossly normal  EKG: Sinus bradycardia 57,  LVH  with repolarization abnormality  ASSESSMENT: 1. 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 Coronary artery disease without angina 3. Orthostatic hypotension-continues to be symptomatic with a syncopal episode recently 4. Hyperlipidemia 5. CKD III 6. Skin cancer 7. Weight loss 8. HTN - improved 9. History of alcohol abuse-abstinent now for 6+ months  PLAN: 1.   Mr. Wix seems to have had less orthostasis off of medications. Renal function also precludes use of a sore ARB or new heart failure medicine such as Entresto. Currently he is only tolerating low-dose beta blocker. This does not give a lot of options as far as treatment for his cardiomyopathy. I would like to reassess his EF in about 3 months which is one year after his last assessment. If LV function remains reduced, I will likely refer him to the advanced heart failure clinic to see if there is options for treatment or whether or not he should be referred for AICD.  Pixie Casino, MD, [facility] Attending Cardiologist [facility] 04/08/2015, 1:02 PM

## 2015-04-15 DIAGNOSIS — E1165 Type 2 diabetes mellitus with hyperglycemia: Secondary | ICD-10-CM | POA: Diagnosis not present

## 2015-04-15 DIAGNOSIS — N184 Chronic kidney disease, stage 4 (severe): Secondary | ICD-10-CM | POA: Diagnosis not present

## 2015-04-15 DIAGNOSIS — I10 Essential (primary) hypertension: Secondary | ICD-10-CM | POA: Diagnosis not present

## 2015-04-22 DIAGNOSIS — I10 Essential (primary) hypertension: Secondary | ICD-10-CM | POA: Diagnosis not present

## 2015-04-22 DIAGNOSIS — E1165 Type 2 diabetes mellitus with hyperglycemia: Secondary | ICD-10-CM | POA: Diagnosis not present

## 2015-04-22 DIAGNOSIS — I2510 Atherosclerotic heart disease of native coronary artery without angina pectoris: Secondary | ICD-10-CM | POA: Diagnosis not present

## 2015-04-22 DIAGNOSIS — D500 Iron deficiency anemia secondary to blood loss (chronic): Secondary | ICD-10-CM | POA: Diagnosis not present

## 2015-04-22 DIAGNOSIS — E782 Mixed hyperlipidemia: Secondary | ICD-10-CM | POA: Diagnosis not present

## 2015-06-02 DIAGNOSIS — N184 Chronic kidney disease, stage 4 (severe): Secondary | ICD-10-CM | POA: Diagnosis not present

## 2015-06-02 DIAGNOSIS — D631 Anemia in chronic kidney disease: Secondary | ICD-10-CM | POA: Diagnosis not present

## 2015-06-02 DIAGNOSIS — E1129 Type 2 diabetes mellitus with other diabetic kidney complication: Secondary | ICD-10-CM | POA: Diagnosis not present

## 2015-06-02 DIAGNOSIS — I129 Hypertensive chronic kidney disease with stage 1 through stage 4 chronic kidney disease, or unspecified chronic kidney disease: Secondary | ICD-10-CM | POA: Diagnosis not present

## 2015-07-08 DIAGNOSIS — I2510 Atherosclerotic heart disease of native coronary artery without angina pectoris: Secondary | ICD-10-CM | POA: Diagnosis not present

## 2015-07-08 DIAGNOSIS — E782 Mixed hyperlipidemia: Secondary | ICD-10-CM | POA: Diagnosis not present

## 2015-07-08 DIAGNOSIS — E1165 Type 2 diabetes mellitus with hyperglycemia: Secondary | ICD-10-CM | POA: Diagnosis not present

## 2015-07-08 DIAGNOSIS — I10 Essential (primary) hypertension: Secondary | ICD-10-CM | POA: Diagnosis not present

## 2015-07-14 ENCOUNTER — Ambulatory Visit (HOSPITAL_COMMUNITY): Payer: Medicare Other | Attending: Cardiology

## 2015-07-14 ENCOUNTER — Other Ambulatory Visit: Payer: Self-pay

## 2015-07-14 DIAGNOSIS — I10 Essential (primary) hypertension: Secondary | ICD-10-CM | POA: Insufficient documentation

## 2015-07-14 DIAGNOSIS — I428 Other cardiomyopathies: Secondary | ICD-10-CM

## 2015-07-14 DIAGNOSIS — I340 Nonrheumatic mitral (valve) insufficiency: Secondary | ICD-10-CM | POA: Diagnosis not present

## 2015-07-14 DIAGNOSIS — I352 Nonrheumatic aortic (valve) stenosis with insufficiency: Secondary | ICD-10-CM | POA: Diagnosis not present

## 2015-07-14 DIAGNOSIS — I429 Cardiomyopathy, unspecified: Secondary | ICD-10-CM | POA: Insufficient documentation

## 2015-07-14 DIAGNOSIS — E785 Hyperlipidemia, unspecified: Secondary | ICD-10-CM | POA: Insufficient documentation

## 2015-07-14 DIAGNOSIS — E119 Type 2 diabetes mellitus without complications: Secondary | ICD-10-CM | POA: Diagnosis not present

## 2015-07-14 DIAGNOSIS — I5189 Other ill-defined heart diseases: Secondary | ICD-10-CM | POA: Diagnosis not present

## 2015-07-15 DIAGNOSIS — Z23 Encounter for immunization: Secondary | ICD-10-CM | POA: Diagnosis not present

## 2015-07-15 DIAGNOSIS — E782 Mixed hyperlipidemia: Secondary | ICD-10-CM | POA: Diagnosis not present

## 2015-07-15 DIAGNOSIS — I10 Essential (primary) hypertension: Secondary | ICD-10-CM | POA: Diagnosis not present

## 2015-07-15 DIAGNOSIS — T148 Other injury of unspecified body region: Secondary | ICD-10-CM | POA: Diagnosis not present

## 2015-07-15 DIAGNOSIS — E1165 Type 2 diabetes mellitus with hyperglycemia: Secondary | ICD-10-CM | POA: Diagnosis not present

## 2015-07-23 ENCOUNTER — Telehealth: Payer: Self-pay | Admitting: Internal Medicine

## 2015-07-23 NOTE — Telephone Encounter (Signed)
Pt's daughter is returning a call about the pt's results to an Echo that was done on 8/29. Please f/u with her  Thanks

## 2015-07-23 NOTE — Telephone Encounter (Signed)
Spoke with patient's daughter and notified her of echo results.

## 2015-07-23 NOTE — Telephone Encounter (Signed)
LMTCB

## 2015-08-22 DIAGNOSIS — L570 Actinic keratosis: Secondary | ICD-10-CM | POA: Diagnosis not present

## 2015-08-22 DIAGNOSIS — L309 Dermatitis, unspecified: Secondary | ICD-10-CM | POA: Diagnosis not present

## 2015-09-16 DIAGNOSIS — N2581 Secondary hyperparathyroidism of renal origin: Secondary | ICD-10-CM | POA: Diagnosis not present

## 2015-09-16 DIAGNOSIS — E1129 Type 2 diabetes mellitus with other diabetic kidney complication: Secondary | ICD-10-CM | POA: Diagnosis not present

## 2015-09-16 DIAGNOSIS — N184 Chronic kidney disease, stage 4 (severe): Secondary | ICD-10-CM | POA: Diagnosis not present

## 2015-09-16 DIAGNOSIS — I129 Hypertensive chronic kidney disease with stage 1 through stage 4 chronic kidney disease, or unspecified chronic kidney disease: Secondary | ICD-10-CM | POA: Diagnosis not present

## 2015-09-16 DIAGNOSIS — D631 Anemia in chronic kidney disease: Secondary | ICD-10-CM | POA: Diagnosis not present

## 2015-10-21 DIAGNOSIS — E1165 Type 2 diabetes mellitus with hyperglycemia: Secondary | ICD-10-CM | POA: Diagnosis not present

## 2015-10-21 DIAGNOSIS — I10 Essential (primary) hypertension: Secondary | ICD-10-CM | POA: Diagnosis not present

## 2015-10-21 DIAGNOSIS — E782 Mixed hyperlipidemia: Secondary | ICD-10-CM | POA: Diagnosis not present

## 2015-10-28 DIAGNOSIS — R1314 Dysphagia, pharyngoesophageal phase: Secondary | ICD-10-CM | POA: Diagnosis not present

## 2015-10-28 DIAGNOSIS — E1165 Type 2 diabetes mellitus with hyperglycemia: Secondary | ICD-10-CM | POA: Diagnosis not present

## 2015-10-28 DIAGNOSIS — E782 Mixed hyperlipidemia: Secondary | ICD-10-CM | POA: Diagnosis not present

## 2015-10-28 DIAGNOSIS — I10 Essential (primary) hypertension: Secondary | ICD-10-CM | POA: Diagnosis not present

## 2015-11-21 DIAGNOSIS — C44619 Basal cell carcinoma of skin of left upper limb, including shoulder: Secondary | ICD-10-CM | POA: Diagnosis not present

## 2015-11-21 DIAGNOSIS — L570 Actinic keratosis: Secondary | ICD-10-CM | POA: Diagnosis not present

## 2016-01-13 DIAGNOSIS — L905 Scar conditions and fibrosis of skin: Secondary | ICD-10-CM | POA: Diagnosis not present

## 2016-01-13 DIAGNOSIS — Z85828 Personal history of other malignant neoplasm of skin: Secondary | ICD-10-CM | POA: Diagnosis not present

## 2016-01-13 DIAGNOSIS — L570 Actinic keratosis: Secondary | ICD-10-CM | POA: Diagnosis not present

## 2016-01-20 DIAGNOSIS — N189 Chronic kidney disease, unspecified: Secondary | ICD-10-CM | POA: Diagnosis not present

## 2016-01-20 DIAGNOSIS — N2581 Secondary hyperparathyroidism of renal origin: Secondary | ICD-10-CM | POA: Diagnosis not present

## 2016-01-20 DIAGNOSIS — E1129 Type 2 diabetes mellitus with other diabetic kidney complication: Secondary | ICD-10-CM | POA: Diagnosis not present

## 2016-01-20 DIAGNOSIS — I129 Hypertensive chronic kidney disease with stage 1 through stage 4 chronic kidney disease, or unspecified chronic kidney disease: Secondary | ICD-10-CM | POA: Diagnosis not present

## 2016-01-20 DIAGNOSIS — N184 Chronic kidney disease, stage 4 (severe): Secondary | ICD-10-CM | POA: Diagnosis not present

## 2016-01-20 DIAGNOSIS — D631 Anemia in chronic kidney disease: Secondary | ICD-10-CM | POA: Diagnosis not present

## 2016-01-27 DIAGNOSIS — E1165 Type 2 diabetes mellitus with hyperglycemia: Secondary | ICD-10-CM | POA: Diagnosis not present

## 2016-01-27 DIAGNOSIS — I10 Essential (primary) hypertension: Secondary | ICD-10-CM | POA: Diagnosis not present

## 2016-01-27 DIAGNOSIS — E1121 Type 2 diabetes mellitus with diabetic nephropathy: Secondary | ICD-10-CM | POA: Diagnosis not present

## 2016-01-27 DIAGNOSIS — E782 Mixed hyperlipidemia: Secondary | ICD-10-CM | POA: Diagnosis not present

## 2016-02-03 DIAGNOSIS — D485 Neoplasm of uncertain behavior of skin: Secondary | ICD-10-CM | POA: Diagnosis not present

## 2016-02-03 DIAGNOSIS — L218 Other seborrheic dermatitis: Secondary | ICD-10-CM | POA: Diagnosis not present

## 2016-02-03 DIAGNOSIS — E039 Hypothyroidism, unspecified: Secondary | ICD-10-CM | POA: Diagnosis not present

## 2016-02-03 DIAGNOSIS — D472 Monoclonal gammopathy: Secondary | ICD-10-CM | POA: Diagnosis not present

## 2016-02-03 DIAGNOSIS — N183 Chronic kidney disease, stage 3 (moderate): Secondary | ICD-10-CM | POA: Diagnosis not present

## 2016-02-03 DIAGNOSIS — L570 Actinic keratosis: Secondary | ICD-10-CM | POA: Diagnosis not present

## 2016-02-03 DIAGNOSIS — E1165 Type 2 diabetes mellitus with hyperglycemia: Secondary | ICD-10-CM | POA: Diagnosis not present

## 2016-03-09 DIAGNOSIS — C44319 Basal cell carcinoma of skin of other parts of face: Secondary | ICD-10-CM | POA: Diagnosis not present

## 2016-03-09 DIAGNOSIS — Z85828 Personal history of other malignant neoplasm of skin: Secondary | ICD-10-CM | POA: Diagnosis not present

## 2016-03-09 DIAGNOSIS — D485 Neoplasm of uncertain behavior of skin: Secondary | ICD-10-CM | POA: Diagnosis not present

## 2016-04-27 DIAGNOSIS — S61411A Laceration without foreign body of right hand, initial encounter: Secondary | ICD-10-CM | POA: Diagnosis not present

## 2016-05-04 DIAGNOSIS — M150 Primary generalized (osteo)arthritis: Secondary | ICD-10-CM | POA: Diagnosis not present

## 2016-05-04 DIAGNOSIS — S61411A Laceration without foreign body of right hand, initial encounter: Secondary | ICD-10-CM | POA: Diagnosis not present

## 2016-05-17 DIAGNOSIS — R2689 Other abnormalities of gait and mobility: Secondary | ICD-10-CM | POA: Diagnosis not present

## 2016-05-17 DIAGNOSIS — I5020 Unspecified systolic (congestive) heart failure: Secondary | ICD-10-CM | POA: Diagnosis not present

## 2016-05-17 DIAGNOSIS — T148 Other injury of unspecified body region: Secondary | ICD-10-CM | POA: Diagnosis not present

## 2016-05-19 DIAGNOSIS — I5020 Unspecified systolic (congestive) heart failure: Secondary | ICD-10-CM | POA: Diagnosis not present

## 2016-05-19 DIAGNOSIS — T148 Other injury of unspecified body region: Secondary | ICD-10-CM | POA: Diagnosis not present

## 2016-05-19 DIAGNOSIS — R2689 Other abnormalities of gait and mobility: Secondary | ICD-10-CM | POA: Diagnosis not present

## 2016-05-19 DIAGNOSIS — Z4800 Encounter for change or removal of nonsurgical wound dressing: Secondary | ICD-10-CM | POA: Diagnosis not present

## 2016-05-19 DIAGNOSIS — S41101A Unspecified open wound of right upper arm, initial encounter: Secondary | ICD-10-CM | POA: Diagnosis not present

## 2016-05-21 DIAGNOSIS — R2689 Other abnormalities of gait and mobility: Secondary | ICD-10-CM | POA: Diagnosis not present

## 2016-05-21 DIAGNOSIS — T148 Other injury of unspecified body region: Secondary | ICD-10-CM | POA: Diagnosis not present

## 2016-05-21 DIAGNOSIS — I5020 Unspecified systolic (congestive) heart failure: Secondary | ICD-10-CM | POA: Diagnosis not present

## 2016-05-24 DIAGNOSIS — I5020 Unspecified systolic (congestive) heart failure: Secondary | ICD-10-CM | POA: Diagnosis not present

## 2016-05-24 DIAGNOSIS — R2689 Other abnormalities of gait and mobility: Secondary | ICD-10-CM | POA: Diagnosis not present

## 2016-05-24 DIAGNOSIS — T148 Other injury of unspecified body region: Secondary | ICD-10-CM | POA: Diagnosis not present

## 2016-05-25 DIAGNOSIS — I5020 Unspecified systolic (congestive) heart failure: Secondary | ICD-10-CM | POA: Diagnosis not present

## 2016-05-25 DIAGNOSIS — T148 Other injury of unspecified body region: Secondary | ICD-10-CM | POA: Diagnosis not present

## 2016-05-25 DIAGNOSIS — R2689 Other abnormalities of gait and mobility: Secondary | ICD-10-CM | POA: Diagnosis not present

## 2016-05-26 DIAGNOSIS — R2689 Other abnormalities of gait and mobility: Secondary | ICD-10-CM | POA: Diagnosis not present

## 2016-05-26 DIAGNOSIS — I5020 Unspecified systolic (congestive) heart failure: Secondary | ICD-10-CM | POA: Diagnosis not present

## 2016-05-26 DIAGNOSIS — T148 Other injury of unspecified body region: Secondary | ICD-10-CM | POA: Diagnosis not present

## 2016-05-28 DIAGNOSIS — R2689 Other abnormalities of gait and mobility: Secondary | ICD-10-CM | POA: Diagnosis not present

## 2016-05-28 DIAGNOSIS — I5020 Unspecified systolic (congestive) heart failure: Secondary | ICD-10-CM | POA: Diagnosis not present

## 2016-05-28 DIAGNOSIS — T148 Other injury of unspecified body region: Secondary | ICD-10-CM | POA: Diagnosis not present

## 2016-05-31 DIAGNOSIS — T148 Other injury of unspecified body region: Secondary | ICD-10-CM | POA: Diagnosis not present

## 2016-05-31 DIAGNOSIS — R2689 Other abnormalities of gait and mobility: Secondary | ICD-10-CM | POA: Diagnosis not present

## 2016-05-31 DIAGNOSIS — I5020 Unspecified systolic (congestive) heart failure: Secondary | ICD-10-CM | POA: Diagnosis not present

## 2016-06-01 DIAGNOSIS — R2689 Other abnormalities of gait and mobility: Secondary | ICD-10-CM | POA: Diagnosis not present

## 2016-06-01 DIAGNOSIS — I5020 Unspecified systolic (congestive) heart failure: Secondary | ICD-10-CM | POA: Diagnosis not present

## 2016-06-01 DIAGNOSIS — T148 Other injury of unspecified body region: Secondary | ICD-10-CM | POA: Diagnosis not present

## 2016-06-02 DIAGNOSIS — R2689 Other abnormalities of gait and mobility: Secondary | ICD-10-CM | POA: Diagnosis not present

## 2016-06-02 DIAGNOSIS — I5020 Unspecified systolic (congestive) heart failure: Secondary | ICD-10-CM | POA: Diagnosis not present

## 2016-06-02 DIAGNOSIS — T148 Other injury of unspecified body region: Secondary | ICD-10-CM | POA: Diagnosis not present

## 2016-06-02 DIAGNOSIS — S41101A Unspecified open wound of right upper arm, initial encounter: Secondary | ICD-10-CM | POA: Diagnosis not present

## 2016-06-03 DIAGNOSIS — R2689 Other abnormalities of gait and mobility: Secondary | ICD-10-CM | POA: Diagnosis not present

## 2016-06-03 DIAGNOSIS — I5020 Unspecified systolic (congestive) heart failure: Secondary | ICD-10-CM | POA: Diagnosis not present

## 2016-06-03 DIAGNOSIS — T148 Other injury of unspecified body region: Secondary | ICD-10-CM | POA: Diagnosis not present

## 2016-06-04 DIAGNOSIS — R2689 Other abnormalities of gait and mobility: Secondary | ICD-10-CM | POA: Diagnosis not present

## 2016-06-04 DIAGNOSIS — I5020 Unspecified systolic (congestive) heart failure: Secondary | ICD-10-CM | POA: Diagnosis not present

## 2016-06-04 DIAGNOSIS — T148 Other injury of unspecified body region: Secondary | ICD-10-CM | POA: Diagnosis not present

## 2016-06-07 ENCOUNTER — Encounter (HOSPITAL_COMMUNITY): Payer: Self-pay | Admitting: Emergency Medicine

## 2016-06-07 ENCOUNTER — Emergency Department (HOSPITAL_COMMUNITY)
Admission: EM | Admit: 2016-06-07 | Discharge: 2016-06-07 | Disposition: A | Discharge status: Home / Self Care | Payer: Medicare Other | Attending: Emergency Medicine | Admitting: Emergency Medicine

## 2016-06-07 DIAGNOSIS — E785 Hyperlipidemia, unspecified: Secondary | ICD-10-CM | POA: Diagnosis not present

## 2016-06-07 DIAGNOSIS — I2510 Atherosclerotic heart disease of native coronary artery without angina pectoris: Secondary | ICD-10-CM | POA: Diagnosis not present

## 2016-06-07 DIAGNOSIS — Z7982 Long term (current) use of aspirin: Secondary | ICD-10-CM | POA: Insufficient documentation

## 2016-06-07 DIAGNOSIS — M1990 Unspecified osteoarthritis, unspecified site: Secondary | ICD-10-CM | POA: Diagnosis not present

## 2016-06-07 DIAGNOSIS — Z87891 Personal history of nicotine dependence: Secondary | ICD-10-CM | POA: Insufficient documentation

## 2016-06-07 DIAGNOSIS — L0390 Cellulitis, unspecified: Secondary | ICD-10-CM | POA: Diagnosis not present

## 2016-06-07 DIAGNOSIS — S41101A Unspecified open wound of right upper arm, initial encounter: Secondary | ICD-10-CM | POA: Diagnosis not present

## 2016-06-07 DIAGNOSIS — M7989 Other specified soft tissue disorders: Secondary | ICD-10-CM | POA: Diagnosis present

## 2016-06-07 DIAGNOSIS — E119 Type 2 diabetes mellitus without complications: Secondary | ICD-10-CM | POA: Insufficient documentation

## 2016-06-07 DIAGNOSIS — T148 Other injury of unspecified body region: Secondary | ICD-10-CM | POA: Diagnosis not present

## 2016-06-07 DIAGNOSIS — E1122 Type 2 diabetes mellitus with diabetic chronic kidney disease: Secondary | ICD-10-CM | POA: Insufficient documentation

## 2016-06-07 DIAGNOSIS — R2689 Other abnormalities of gait and mobility: Secondary | ICD-10-CM | POA: Diagnosis not present

## 2016-06-07 DIAGNOSIS — N182 Chronic kidney disease, stage 2 (mild): Secondary | ICD-10-CM | POA: Diagnosis not present

## 2016-06-07 DIAGNOSIS — I5020 Unspecified systolic (congestive) heart failure: Secondary | ICD-10-CM | POA: Diagnosis not present

## 2016-06-07 DIAGNOSIS — L03113 Cellulitis of right upper limb: Secondary | ICD-10-CM | POA: Insufficient documentation

## 2016-06-07 DIAGNOSIS — S51819A Laceration without foreign body of unspecified forearm, initial encounter: Secondary | ICD-10-CM | POA: Diagnosis not present

## 2016-06-07 DIAGNOSIS — I129 Hypertensive chronic kidney disease with stage 1 through stage 4 chronic kidney disease, or unspecified chronic kidney disease: Secondary | ICD-10-CM | POA: Diagnosis not present

## 2016-06-07 LAB — BASIC METABOLIC PANEL
Anion gap: 7 (ref 5–15)
BUN: 26 mg/dL — ABNORMAL HIGH (ref 6–20)
CO2: 25 mmol/L (ref 22–32)
Calcium: 9.3 mg/dL (ref 8.9–10.3)
Chloride: 104 mmol/L (ref 101–111)
Creatinine, Ser: 2.15 mg/dL — ABNORMAL HIGH (ref 0.61–1.24)
GFR calc Af Amer: 31 mL/min — ABNORMAL LOW (ref >60)
GFR calc non Af Amer: 26 mL/min — ABNORMAL LOW (ref >60)
Glucose, Bld: 156 mg/dL — ABNORMAL HIGH (ref 65–99)
Potassium: 3.9 mmol/L (ref 3.5–5.1)
Sodium: 136 mmol/L (ref 135–145)

## 2016-06-07 LAB — CBC WITH DIFFERENTIAL/PLATELET
Basophils Absolute: 0.1 K/uL (ref 0.0–0.1)
Basophils Relative: 1 %
Eosinophils Absolute: 1.1 K/uL — ABNORMAL HIGH (ref 0.0–0.7)
Eosinophils Relative: 16 %
HCT: 33.9 % — ABNORMAL LOW (ref 39.0–52.0)
Hemoglobin: 11.2 g/dL — ABNORMAL LOW (ref 13.0–17.0)
Lymphocytes Relative: 11 %
Lymphs Abs: 0.7 K/uL (ref 0.7–4.0)
MCH: 33 pg (ref 26.0–34.0)
MCHC: 33 g/dL (ref 30.0–36.0)
MCV: 100 fL (ref 78.0–100.0)
Monocytes Absolute: 0.6 K/uL (ref 0.1–1.0)
Monocytes Relative: 8 %
Neutro Abs: 4.5 K/uL (ref 1.7–7.7)
Neutrophils Relative %: 65 %
Platelets: 86 K/uL — ABNORMAL LOW (ref 150–400)
RBC: 3.39 MIL/uL — ABNORMAL LOW (ref 4.22–5.81)
RDW: 13.8 % (ref 11.5–15.5)
WBC: 6.9 K/uL (ref 4.0–10.5)

## 2016-06-07 MED ORDER — VANCOMYCIN HCL 500 MG IV SOLR: 500.0000 mg | INTRAVENOUS | Status: DC

## 2016-06-07 MED ORDER — CEPHALEXIN 500 MG PO CAPS: 500.0000 mg | ORAL_CAPSULE | Freq: Two times a day (BID) | ORAL | 0 refills | Status: AC

## 2016-06-07 MED ORDER — VANCOMYCIN HCL IN DEXTROSE 1-5 GM/200ML-% IV SOLN
1000.0000 mg | INTRAVENOUS | Status: AC
  Administered 2016-06-07: 1000 mg via INTRAVENOUS
  Filled 2016-06-07: qty 200

## 2016-06-07 MED ORDER — DOXYCYCLINE HYCLATE 100 MG PO CAPS
100.0000 mg | ORAL_CAPSULE | Freq: Two times a day (BID) | ORAL | 0 refills | Status: AC
Start: 2016-06-07 — End: ?

## 2016-06-07 NOTE — ED Triage Notes (Addendum)
Patient was sent from PCP to ED for wound on right arm from a fall that has been being taken care of by home nurse.  Patient been having swelling, painful and draining in the past 2-3 days.

## 2016-06-07 NOTE — ED Provider Notes (Signed)
[facility] DEPT Provider Note   CSN: [id number] Arrival date & time: 06/07/16  1026  First Provider Contact:  None    By signing my name below, I, Rayna Sexton, attest that this documentation has been prepared under the direction and in the presence of Virgel Manifold, MD. Electronically Signed: Rayna Sexton, ED Scribe. 06/07/16. 12:45 PM.   History   Chief Complaint Chief Complaint  Patient presents with  . arm infection  . Arm Swelling    HPI HPI Comments: Bryan Pham is a 82 y.o. male with a PMHx of NIDDM who presents to the Emergency Department complaining of a moderate wound to his right forearm x 2 weeks which worsened beginning 3 days ago. Pt fell about 2 weeks ago resulting in a wound to his right forearm. He has a caregiver who has been applying bandaging and cleaning his wound and beginning 3 days ago he began experiencing mild purulent drainage from the site with worsening pain and redness surrounding the wound. He was evaluated by his listed PCP this morning who recommended he come to the ED for further evaluation. His pain worsens with palpation. He denies any recent falls. His daughter denies he has been on abx recently. Pt denies fevers and chills.   The history is provided by the patient and a relative. No language interpreter was used.    Past Medical History:  Diagnosis Date  . Abdominal pain 10/12   pt was having abdominal pain over whole abdominal area   . Arthritis   . CAD (coronary artery disease)    multiple PCI to diag system in 1990; echo 08/09/11- EF 40-45%, mild AR and mild MR; myoview 10/22/2009-EF 53%, defect in the inferior region, no ischemai or scar  . Diabetes mellitus   . Family history of anesthesia complication    daughter has nausea  . GERD (gastroesophageal reflux disease)   . Hyperlipidemia   . Hypertension   . Kidney disease    mild  . Leg swelling    left leg   . Orthostatic hypotension 07/2012   66mmHg drop in SBP, lasix and  atenolol was D/C    Patient Active Problem List   Diagnosis Date Noted  . Memory loss 08/10/2014  . Syncope 08/08/2014  . Hypokalemia 08/08/2014  . Essential hypertension 07/13/2014  . Alcoholic cardiomyopathy ([place]) 06/12/2014  . Thrombocytopenia, unspecified ([place]) 06/14/2013  . Thrombocytopenia ([place]) 05/24/2013  . Orthostatic hypotension 05/22/2013  . CAD (coronary artery disease) 05/22/2013  . Hyperlipidemia 05/22/2013  . CKD (chronic kidney disease), stage III 05/22/2013  . Skin cancer 05/22/2013  . Cholecystitis 08/30/2011    Past Surgical History:  Procedure Laterality Date  . BACK SURGERY  2009  . CARDIAC CATHETERIZATION  04/16/2003   EF >55%, significant HTN, mod disease at the distal portion of the LAD which is not amenable to PCI and a mod lesion in the prox portion of the OM 1, medical therapy  . CARDIAC CATHETERIZATION  06/11/1999   nl LV function, MVP, minor irregularities in the LAD and diag with no high grade stenosis  . CARDIAC CATHETERIZATION  06/14/1997   90% narrowing of the ostial portion of the first diag, no restenosis of prior PCI in LAD or OM 1, nl EF  . CHOLECYSTECTOMY  09/07/11  . CORONARY ANGIOPLASTY  08/01/1996   2V CAD involving the distal LAD and OM1, angioplasty of distal LAD and the OM1      Home Medications    Prior to Admission  medications   Medication Sig Start Date End Date Taking? Authorizing Provider  aspirin 81 MG tablet Take 81 mg by mouth daily.     Historical Provider, MD  atorvastatin (LIPITOR) 40 MG tablet Take 40 mg by mouth daily. 07/04/14   Historical Provider, MD  calcitRIOL (ROCALTROL) 0.5 MCG capsule Take 0.5 mcg by mouth daily.    Historical Provider, MD  carvedilol (COREG) 3.125 MG tablet Take 3.125 mg by mouth 2 (two) times daily with a meal.    Historical Provider, MD  levothyroxine (SYNTHROID, LEVOTHROID) 25 MCG tablet Take 25 mcg by mouth daily.      Historical Provider, MD  tamsulosin (FLOMAX) 0.4 MG CAPS capsule Take 0.4  mg by mouth daily after breakfast.    Historical Provider, MD    Family History Family History  Problem Relation Age of Onset  . Cancer Sister     colon    Social History Social History  Substance Use Topics  . Smoking status: Former Smoker    Quit date: 11/16/1967  . Smokeless tobacco: Never Used  . Alcohol use No     Allergies   Benadryl [diphenhydramine hcl]   Review of Systems Review of Systems  Constitutional: Negative for chills and fever.  Musculoskeletal: Positive for myalgias.  Skin: Positive for color change and wound.  All other systems reviewed and are negative.   Physical Exam Updated Vital Signs BP 120/59 (BP Location: Left Arm)   Pulse 76   Temp 98.2 F (36.8 C) (Oral)   Resp 17   Ht 5\' 9"  (1.753 m)   Wt 159 lb (72.1 kg)   SpO2 99%   BMI 23.48 kg/m   Physical Exam  Constitutional: He is oriented to person, place, and time. He appears well-developed and well-nourished.  HENT:  Head: Normocephalic.  Eyes: EOM are normal.  Neck: Normal range of motion.  Pulmonary/Chest: Effort normal.  Abdominal: He exhibits no distension.  Musculoskeletal: Normal range of motion. He exhibits edema.  Neurological: He is alert and oriented to person, place, and time.  Skin: There is erythema.  Macerated appearance to the dorsal right forearm; swelling, redness and increased warmth distally consistent with cellulitis; neurovascularly intact  Psychiatric: He has a normal mood and affect.  Nursing note and vitals reviewed.   ED Treatments / Results  Labs (all labs ordered are listed, but only abnormal results are displayed) Labs Reviewed  CBC WITH DIFFERENTIAL/PLATELET - Abnormal; Notable for the following:       Result Value   RBC 3.39 (*)    Hemoglobin 11.2 (*)    HCT 33.9 (*)    Platelets 86 (*)    Eosinophils Absolute 1.1 (*)    All other components within normal limits  BASIC METABOLIC PANEL - Abnormal; Notable for the following:    Glucose, Bld 156  (*)    BUN 26 (*)    Creatinine, Ser 2.15 (*)    GFR calc non Af Amer 26 (*)    GFR calc Af Amer 31 (*)    All other components within normal limits    EKG  EKG Interpretation None       Radiology No results found.  Procedures Procedures  DIAGNOSTIC STUDIES: Oxygen Saturation is 99% on RA, normal by my interpretation.    COORDINATION OF CARE: 12:41 PM Discussed next steps with pt. Pt verbalized understanding and is agreeable with the plan.    Medications Ordered in ED Medications - No data to display  Initial Impression / Assessment and Plan / ED Course  I have reviewed the triage vital signs and the nursing notes.  Pertinent labs & imaging results that were available during my care of the patient were reviewed by me and considered in my medical decision making (see chart for details).  Clinical Course    82 year old male with cellulitis to his right forearm. Does not significantly involve the hand. He is afebrile. No leukocytosis. He has not been on antibiotics at this point. Discussed with medicine for admission as family seems to have this expectation. They would like to give a trial of oral abx first. I think this is very reasonable. It has been determined that no acute conditions requiring further emergency intervention are present at this time. The patient has been advised of the diagnosis and plan. I reviewed any labs and imaging including any potential incidental findings. We have discussed signs and symptoms that warrant return to the ED and they are listed in the discharge instructions.     I personally preformed the services scribed in my presence. The recorded information has been reviewed is accurate. Virgel Manifold, MD.   Final Clinical Impressions(s) / ED Diagnoses   Final diagnoses:  Cellulitis of right upper extremity    New Prescriptions New Prescriptions   No medications on file     Virgel Manifold, MD 06/07/16 1514

## 2016-06-07 NOTE — Progress Notes (Signed)
Pharmacy Antibiotic Note  DEKLIN KILMARTIN is a 82 y.o. male admitted on 06/07/2016 with a fall 2 weeks ago, sustaining a wound to his right forearm. Over the past three days, pain and redness has increased and it started draining. Pharmacy is asked to start Vancomycin for cellulitis. Hx CKD Stage III. SCr 2.15 on admission(unsure of his baseline), CrCl 25.    Plan: Vanc 1g x 1 then 500mg  IV q24h. Goal trough 10-67mcg/ml. Measure Vanc trough at steady state. Follow up renal fxn, culture results, and clinical course.  Height: 5\' 9"  (175.3 cm) Weight: 159 lb (72.1 kg) IBW/kg (Calculated) : 70.7  Temp (24hrs), Avg:98.2 F (36.8 C), Min:98.2 F (36.8 C), Max:98.2 F (36.8 C)   Recent Labs Lab 06/07/16 1255  WBC 6.9  CREATININE 2.15*    Estimated Creatinine Clearance: 25.1 mL/min (by C-G formula based on SCr of 2.15 mg/dL).    Allergies  Allergen Reactions  . Benadryl [Diphenhydramine Hcl]     shaking  . Omeprazole Other (See Comments)    MD advised to stay away from this medication due to Kidney problems.Marland Kitchen    Antimicrobials this admission: Vanc 7/24 >>   Dose adjustments this admission:  -   Microbiology results: None  Thank you for allowing pharmacy to be a part of this patient's care.  Romeo Rabon, PharmD, pager [phone]. 06/07/2016,1:41 PM.

## 2016-06-08 DIAGNOSIS — I5020 Unspecified systolic (congestive) heart failure: Secondary | ICD-10-CM | POA: Diagnosis not present

## 2016-06-08 DIAGNOSIS — R2689 Other abnormalities of gait and mobility: Secondary | ICD-10-CM | POA: Diagnosis not present

## 2016-06-08 DIAGNOSIS — T148 Other injury of unspecified body region: Secondary | ICD-10-CM | POA: Diagnosis not present

## 2016-06-09 DIAGNOSIS — R2689 Other abnormalities of gait and mobility: Secondary | ICD-10-CM | POA: Diagnosis not present

## 2016-06-09 DIAGNOSIS — I5020 Unspecified systolic (congestive) heart failure: Secondary | ICD-10-CM | POA: Diagnosis not present

## 2016-06-09 DIAGNOSIS — T148 Other injury of unspecified body region: Secondary | ICD-10-CM | POA: Diagnosis not present

## 2016-06-10 DIAGNOSIS — I5020 Unspecified systolic (congestive) heart failure: Secondary | ICD-10-CM | POA: Diagnosis not present

## 2016-06-10 DIAGNOSIS — R2689 Other abnormalities of gait and mobility: Secondary | ICD-10-CM | POA: Diagnosis not present

## 2016-06-10 DIAGNOSIS — T148 Other injury of unspecified body region: Secondary | ICD-10-CM | POA: Diagnosis not present

## 2016-06-10 DIAGNOSIS — S41101A Unspecified open wound of right upper arm, initial encounter: Secondary | ICD-10-CM | POA: Diagnosis not present

## 2016-06-11 DIAGNOSIS — T148 Other injury of unspecified body region: Secondary | ICD-10-CM | POA: Diagnosis not present

## 2016-06-11 DIAGNOSIS — S4991XA Unspecified injury of right shoulder and upper arm, initial encounter: Secondary | ICD-10-CM | POA: Diagnosis not present

## 2016-06-11 DIAGNOSIS — M79601 Pain in right arm: Secondary | ICD-10-CM | POA: Diagnosis not present

## 2016-06-11 DIAGNOSIS — E119 Type 2 diabetes mellitus without complications: Secondary | ICD-10-CM | POA: Diagnosis not present

## 2016-06-11 DIAGNOSIS — I5020 Unspecified systolic (congestive) heart failure: Secondary | ICD-10-CM | POA: Diagnosis not present

## 2016-06-11 DIAGNOSIS — R2689 Other abnormalities of gait and mobility: Secondary | ICD-10-CM | POA: Diagnosis not present

## 2016-06-14 DIAGNOSIS — T148 Other injury of unspecified body region: Secondary | ICD-10-CM | POA: Diagnosis not present

## 2016-06-14 DIAGNOSIS — R2689 Other abnormalities of gait and mobility: Secondary | ICD-10-CM | POA: Diagnosis not present

## 2016-06-14 DIAGNOSIS — S41101A Unspecified open wound of right upper arm, initial encounter: Secondary | ICD-10-CM | POA: Diagnosis not present

## 2016-06-14 DIAGNOSIS — I5020 Unspecified systolic (congestive) heart failure: Secondary | ICD-10-CM | POA: Diagnosis not present

## 2016-06-15 DIAGNOSIS — T148 Other injury of unspecified body region: Secondary | ICD-10-CM | POA: Diagnosis not present

## 2016-06-15 DIAGNOSIS — I5020 Unspecified systolic (congestive) heart failure: Secondary | ICD-10-CM | POA: Diagnosis not present

## 2016-06-15 DIAGNOSIS — R2689 Other abnormalities of gait and mobility: Secondary | ICD-10-CM | POA: Diagnosis not present

## 2016-06-16 DIAGNOSIS — R2689 Other abnormalities of gait and mobility: Secondary | ICD-10-CM | POA: Diagnosis not present

## 2016-06-16 DIAGNOSIS — I5020 Unspecified systolic (congestive) heart failure: Secondary | ICD-10-CM | POA: Diagnosis not present

## 2016-06-16 DIAGNOSIS — T148 Other injury of unspecified body region: Secondary | ICD-10-CM | POA: Diagnosis not present

## 2016-06-18 DIAGNOSIS — T148 Other injury of unspecified body region: Secondary | ICD-10-CM | POA: Diagnosis not present

## 2016-06-18 DIAGNOSIS — I5020 Unspecified systolic (congestive) heart failure: Secondary | ICD-10-CM | POA: Diagnosis not present

## 2016-06-18 DIAGNOSIS — R2689 Other abnormalities of gait and mobility: Secondary | ICD-10-CM | POA: Diagnosis not present

## 2016-06-21 DIAGNOSIS — I5020 Unspecified systolic (congestive) heart failure: Secondary | ICD-10-CM | POA: Diagnosis not present

## 2016-06-21 DIAGNOSIS — R2689 Other abnormalities of gait and mobility: Secondary | ICD-10-CM | POA: Diagnosis not present

## 2016-06-21 DIAGNOSIS — E782 Mixed hyperlipidemia: Secondary | ICD-10-CM | POA: Diagnosis not present

## 2016-06-21 DIAGNOSIS — T148 Other injury of unspecified body region: Secondary | ICD-10-CM | POA: Diagnosis not present

## 2016-06-21 DIAGNOSIS — E1165 Type 2 diabetes mellitus with hyperglycemia: Secondary | ICD-10-CM | POA: Diagnosis not present

## 2016-06-21 DIAGNOSIS — I10 Essential (primary) hypertension: Secondary | ICD-10-CM | POA: Diagnosis not present

## 2016-06-21 DIAGNOSIS — I2510 Atherosclerotic heart disease of native coronary artery without angina pectoris: Secondary | ICD-10-CM | POA: Diagnosis not present

## 2016-06-23 DIAGNOSIS — R2689 Other abnormalities of gait and mobility: Secondary | ICD-10-CM | POA: Diagnosis not present

## 2016-06-23 DIAGNOSIS — T148 Other injury of unspecified body region: Secondary | ICD-10-CM | POA: Diagnosis not present

## 2016-06-23 DIAGNOSIS — I5020 Unspecified systolic (congestive) heart failure: Secondary | ICD-10-CM | POA: Diagnosis not present

## 2016-06-24 DIAGNOSIS — T148 Other injury of unspecified body region: Secondary | ICD-10-CM | POA: Diagnosis not present

## 2016-06-24 DIAGNOSIS — I5020 Unspecified systolic (congestive) heart failure: Secondary | ICD-10-CM | POA: Diagnosis not present

## 2016-06-24 DIAGNOSIS — R2689 Other abnormalities of gait and mobility: Secondary | ICD-10-CM | POA: Diagnosis not present

## 2016-06-25 DIAGNOSIS — E1165 Type 2 diabetes mellitus with hyperglycemia: Secondary | ICD-10-CM | POA: Diagnosis not present

## 2016-06-25 DIAGNOSIS — R2689 Other abnormalities of gait and mobility: Secondary | ICD-10-CM | POA: Diagnosis not present

## 2016-06-25 DIAGNOSIS — I2510 Atherosclerotic heart disease of native coronary artery without angina pectoris: Secondary | ICD-10-CM | POA: Diagnosis not present

## 2016-06-25 DIAGNOSIS — I5020 Unspecified systolic (congestive) heart failure: Secondary | ICD-10-CM | POA: Diagnosis not present

## 2016-06-25 DIAGNOSIS — T148 Other injury of unspecified body region: Secondary | ICD-10-CM | POA: Diagnosis not present

## 2016-06-25 DIAGNOSIS — E1121 Type 2 diabetes mellitus with diabetic nephropathy: Secondary | ICD-10-CM | POA: Diagnosis not present

## 2016-06-25 DIAGNOSIS — E782 Mixed hyperlipidemia: Secondary | ICD-10-CM | POA: Diagnosis not present

## 2016-06-28 DIAGNOSIS — I5020 Unspecified systolic (congestive) heart failure: Secondary | ICD-10-CM | POA: Diagnosis not present

## 2016-06-28 DIAGNOSIS — T148 Other injury of unspecified body region: Secondary | ICD-10-CM | POA: Diagnosis not present

## 2016-06-28 DIAGNOSIS — R2689 Other abnormalities of gait and mobility: Secondary | ICD-10-CM | POA: Diagnosis not present

## 2016-07-01 DIAGNOSIS — R2689 Other abnormalities of gait and mobility: Secondary | ICD-10-CM | POA: Diagnosis not present

## 2016-07-01 DIAGNOSIS — I5020 Unspecified systolic (congestive) heart failure: Secondary | ICD-10-CM | POA: Diagnosis not present

## 2016-07-01 DIAGNOSIS — T148 Other injury of unspecified body region: Secondary | ICD-10-CM | POA: Diagnosis not present

## 2016-07-05 DIAGNOSIS — R2689 Other abnormalities of gait and mobility: Secondary | ICD-10-CM | POA: Diagnosis not present

## 2016-07-05 DIAGNOSIS — I5020 Unspecified systolic (congestive) heart failure: Secondary | ICD-10-CM | POA: Diagnosis not present

## 2016-07-05 DIAGNOSIS — T148 Other injury of unspecified body region: Secondary | ICD-10-CM | POA: Diagnosis not present

## 2016-07-09 DIAGNOSIS — T148 Other injury of unspecified body region: Secondary | ICD-10-CM | POA: Diagnosis not present

## 2016-07-09 DIAGNOSIS — R2689 Other abnormalities of gait and mobility: Secondary | ICD-10-CM | POA: Diagnosis not present

## 2016-07-09 DIAGNOSIS — I5020 Unspecified systolic (congestive) heart failure: Secondary | ICD-10-CM | POA: Diagnosis not present

## 2016-07-12 DIAGNOSIS — S41101A Unspecified open wound of right upper arm, initial encounter: Secondary | ICD-10-CM | POA: Diagnosis not present

## 2016-07-13 DIAGNOSIS — R2689 Other abnormalities of gait and mobility: Secondary | ICD-10-CM | POA: Diagnosis not present

## 2016-07-13 DIAGNOSIS — T148 Other injury of unspecified body region: Secondary | ICD-10-CM | POA: Diagnosis not present

## 2016-07-13 DIAGNOSIS — I5020 Unspecified systolic (congestive) heart failure: Secondary | ICD-10-CM | POA: Diagnosis not present

## 2016-07-15 DIAGNOSIS — T148 Other injury of unspecified body region: Secondary | ICD-10-CM | POA: Diagnosis not present

## 2016-07-15 DIAGNOSIS — S41101A Unspecified open wound of right upper arm, initial encounter: Secondary | ICD-10-CM | POA: Diagnosis not present

## 2016-07-15 DIAGNOSIS — I5020 Unspecified systolic (congestive) heart failure: Secondary | ICD-10-CM | POA: Diagnosis not present

## 2016-07-15 DIAGNOSIS — R2689 Other abnormalities of gait and mobility: Secondary | ICD-10-CM | POA: Diagnosis not present

## 2016-07-26 DIAGNOSIS — N2581 Secondary hyperparathyroidism of renal origin: Secondary | ICD-10-CM | POA: Diagnosis not present

## 2016-07-26 DIAGNOSIS — E1129 Type 2 diabetes mellitus with other diabetic kidney complication: Secondary | ICD-10-CM | POA: Diagnosis not present

## 2016-07-26 DIAGNOSIS — I129 Hypertensive chronic kidney disease with stage 1 through stage 4 chronic kidney disease, or unspecified chronic kidney disease: Secondary | ICD-10-CM | POA: Diagnosis not present

## 2016-07-26 DIAGNOSIS — D631 Anemia in chronic kidney disease: Secondary | ICD-10-CM | POA: Diagnosis not present

## 2016-07-26 DIAGNOSIS — N189 Chronic kidney disease, unspecified: Secondary | ICD-10-CM | POA: Diagnosis not present

## 2016-07-26 DIAGNOSIS — N184 Chronic kidney disease, stage 4 (severe): Secondary | ICD-10-CM | POA: Diagnosis not present

## 2016-08-16 DIAGNOSIS — I119 Hypertensive heart disease without heart failure: Secondary | ICD-10-CM | POA: Diagnosis not present

## 2016-08-16 DIAGNOSIS — R279 Unspecified lack of coordination: Secondary | ICD-10-CM | POA: Diagnosis not present

## 2016-08-16 DIAGNOSIS — R300 Dysuria: Secondary | ICD-10-CM | POA: Diagnosis not present

## 2016-08-16 DIAGNOSIS — H8149 Vertigo of central origin, unspecified ear: Secondary | ICD-10-CM | POA: Diagnosis not present

## 2016-08-16 DIAGNOSIS — R410 Disorientation, unspecified: Secondary | ICD-10-CM | POA: Diagnosis not present

## 2016-08-17 DIAGNOSIS — R410 Disorientation, unspecified: Secondary | ICD-10-CM | POA: Diagnosis not present

## 2016-08-17 DIAGNOSIS — H8149 Vertigo of central origin, unspecified ear: Secondary | ICD-10-CM | POA: Diagnosis not present

## 2016-08-17 DIAGNOSIS — R279 Unspecified lack of coordination: Secondary | ICD-10-CM | POA: Diagnosis not present

## 2016-08-17 DIAGNOSIS — I119 Hypertensive heart disease without heart failure: Secondary | ICD-10-CM | POA: Diagnosis not present

## 2016-08-17 DIAGNOSIS — R300 Dysuria: Secondary | ICD-10-CM | POA: Diagnosis not present

## 2016-08-20 DIAGNOSIS — I119 Hypertensive heart disease without heart failure: Secondary | ICD-10-CM | POA: Diagnosis not present

## 2016-08-20 DIAGNOSIS — H8149 Vertigo of central origin, unspecified ear: Secondary | ICD-10-CM | POA: Diagnosis not present

## 2016-08-20 DIAGNOSIS — R300 Dysuria: Secondary | ICD-10-CM | POA: Diagnosis not present

## 2016-08-20 DIAGNOSIS — R410 Disorientation, unspecified: Secondary | ICD-10-CM | POA: Diagnosis not present

## 2016-08-20 DIAGNOSIS — R279 Unspecified lack of coordination: Secondary | ICD-10-CM | POA: Diagnosis not present

## 2016-08-21 DIAGNOSIS — R279 Unspecified lack of coordination: Secondary | ICD-10-CM | POA: Diagnosis not present

## 2016-08-21 DIAGNOSIS — I119 Hypertensive heart disease without heart failure: Secondary | ICD-10-CM | POA: Diagnosis not present

## 2016-08-21 DIAGNOSIS — R300 Dysuria: Secondary | ICD-10-CM | POA: Diagnosis not present

## 2016-08-21 DIAGNOSIS — R410 Disorientation, unspecified: Secondary | ICD-10-CM | POA: Diagnosis not present

## 2016-08-21 DIAGNOSIS — H8149 Vertigo of central origin, unspecified ear: Secondary | ICD-10-CM | POA: Diagnosis not present

## 2016-08-23 DIAGNOSIS — R410 Disorientation, unspecified: Secondary | ICD-10-CM | POA: Diagnosis not present

## 2016-08-23 DIAGNOSIS — H8149 Vertigo of central origin, unspecified ear: Secondary | ICD-10-CM | POA: Diagnosis not present

## 2016-08-23 DIAGNOSIS — R279 Unspecified lack of coordination: Secondary | ICD-10-CM | POA: Diagnosis not present

## 2016-08-23 DIAGNOSIS — R300 Dysuria: Secondary | ICD-10-CM | POA: Diagnosis not present

## 2016-08-23 DIAGNOSIS — I119 Hypertensive heart disease without heart failure: Secondary | ICD-10-CM | POA: Diagnosis not present

## 2016-08-25 DIAGNOSIS — I119 Hypertensive heart disease without heart failure: Secondary | ICD-10-CM | POA: Diagnosis not present

## 2016-08-25 DIAGNOSIS — R410 Disorientation, unspecified: Secondary | ICD-10-CM | POA: Diagnosis not present

## 2016-08-25 DIAGNOSIS — R279 Unspecified lack of coordination: Secondary | ICD-10-CM | POA: Diagnosis not present

## 2016-08-25 DIAGNOSIS — H8149 Vertigo of central origin, unspecified ear: Secondary | ICD-10-CM | POA: Diagnosis not present

## 2016-08-25 DIAGNOSIS — R300 Dysuria: Secondary | ICD-10-CM | POA: Diagnosis not present

## 2016-08-26 DIAGNOSIS — R410 Disorientation, unspecified: Secondary | ICD-10-CM | POA: Diagnosis not present

## 2016-08-26 DIAGNOSIS — H8149 Vertigo of central origin, unspecified ear: Secondary | ICD-10-CM | POA: Diagnosis not present

## 2016-08-26 DIAGNOSIS — R300 Dysuria: Secondary | ICD-10-CM | POA: Diagnosis not present

## 2016-08-26 DIAGNOSIS — I119 Hypertensive heart disease without heart failure: Secondary | ICD-10-CM | POA: Diagnosis not present

## 2016-08-26 DIAGNOSIS — R279 Unspecified lack of coordination: Secondary | ICD-10-CM | POA: Diagnosis not present

## 2016-08-27 DIAGNOSIS — R279 Unspecified lack of coordination: Secondary | ICD-10-CM | POA: Diagnosis not present

## 2016-08-27 DIAGNOSIS — H8149 Vertigo of central origin, unspecified ear: Secondary | ICD-10-CM | POA: Diagnosis not present

## 2016-08-27 DIAGNOSIS — R410 Disorientation, unspecified: Secondary | ICD-10-CM | POA: Diagnosis not present

## 2016-08-27 DIAGNOSIS — I119 Hypertensive heart disease without heart failure: Secondary | ICD-10-CM | POA: Diagnosis not present

## 2016-08-27 DIAGNOSIS — R300 Dysuria: Secondary | ICD-10-CM | POA: Diagnosis not present

## 2016-09-02 DIAGNOSIS — R300 Dysuria: Secondary | ICD-10-CM | POA: Diagnosis not present

## 2016-09-02 DIAGNOSIS — R410 Disorientation, unspecified: Secondary | ICD-10-CM | POA: Diagnosis not present

## 2016-09-02 DIAGNOSIS — R279 Unspecified lack of coordination: Secondary | ICD-10-CM | POA: Diagnosis not present

## 2016-09-02 DIAGNOSIS — I119 Hypertensive heart disease without heart failure: Secondary | ICD-10-CM | POA: Diagnosis not present

## 2016-09-02 DIAGNOSIS — H8149 Vertigo of central origin, unspecified ear: Secondary | ICD-10-CM | POA: Diagnosis not present

## 2016-09-09 DIAGNOSIS — R300 Dysuria: Secondary | ICD-10-CM | POA: Diagnosis not present

## 2016-09-09 DIAGNOSIS — I119 Hypertensive heart disease without heart failure: Secondary | ICD-10-CM | POA: Diagnosis not present

## 2016-09-09 DIAGNOSIS — R410 Disorientation, unspecified: Secondary | ICD-10-CM | POA: Diagnosis not present

## 2016-09-09 DIAGNOSIS — R279 Unspecified lack of coordination: Secondary | ICD-10-CM | POA: Diagnosis not present

## 2016-09-09 DIAGNOSIS — H8149 Vertigo of central origin, unspecified ear: Secondary | ICD-10-CM | POA: Diagnosis not present

## 2016-09-14 DIAGNOSIS — N184 Chronic kidney disease, stage 4 (severe): Secondary | ICD-10-CM | POA: Diagnosis not present

## 2016-09-14 DIAGNOSIS — R42 Dizziness and giddiness: Secondary | ICD-10-CM | POA: Diagnosis not present

## 2016-09-14 DIAGNOSIS — R300 Dysuria: Secondary | ICD-10-CM | POA: Diagnosis not present

## 2016-09-14 DIAGNOSIS — H8149 Vertigo of central origin, unspecified ear: Secondary | ICD-10-CM | POA: Diagnosis not present

## 2016-09-14 DIAGNOSIS — N289 Disorder of kidney and ureter, unspecified: Secondary | ICD-10-CM | POA: Diagnosis not present

## 2016-09-14 DIAGNOSIS — N179 Acute kidney failure, unspecified: Secondary | ICD-10-CM | POA: Diagnosis not present

## 2016-09-14 DIAGNOSIS — I10 Essential (primary) hypertension: Secondary | ICD-10-CM | POA: Diagnosis not present

## 2016-09-14 DIAGNOSIS — M6281 Muscle weakness (generalized): Secondary | ICD-10-CM | POA: Diagnosis not present

## 2016-09-14 DIAGNOSIS — N390 Urinary tract infection, site not specified: Secondary | ICD-10-CM | POA: Diagnosis not present

## 2016-09-14 DIAGNOSIS — D696 Thrombocytopenia, unspecified: Secondary | ICD-10-CM | POA: Diagnosis not present

## 2016-09-14 DIAGNOSIS — I129 Hypertensive chronic kidney disease with stage 1 through stage 4 chronic kidney disease, or unspecified chronic kidney disease: Secondary | ICD-10-CM | POA: Diagnosis not present

## 2016-09-14 DIAGNOSIS — D649 Anemia, unspecified: Secondary | ICD-10-CM | POA: Diagnosis not present

## 2016-09-14 DIAGNOSIS — I119 Hypertensive heart disease without heart failure: Secondary | ICD-10-CM | POA: Diagnosis not present

## 2016-09-14 DIAGNOSIS — N183 Chronic kidney disease, stage 3 (moderate): Secondary | ICD-10-CM | POA: Diagnosis not present

## 2016-09-14 DIAGNOSIS — Z23 Encounter for immunization: Secondary | ICD-10-CM | POA: Diagnosis not present

## 2016-09-14 DIAGNOSIS — R531 Weakness: Secondary | ICD-10-CM | POA: Diagnosis not present

## 2016-09-14 DIAGNOSIS — R279 Unspecified lack of coordination: Secondary | ICD-10-CM | POA: Diagnosis not present

## 2016-09-14 DIAGNOSIS — R410 Disorientation, unspecified: Secondary | ICD-10-CM | POA: Diagnosis not present

## 2016-09-14 DIAGNOSIS — G309 Alzheimer's disease, unspecified: Secondary | ICD-10-CM | POA: Diagnosis not present

## 2016-09-14 DIAGNOSIS — G92 Toxic encephalopathy: Secondary | ICD-10-CM | POA: Diagnosis not present

## 2016-09-14 DIAGNOSIS — E861 Hypovolemia: Secondary | ICD-10-CM | POA: Diagnosis not present

## 2016-09-14 DIAGNOSIS — R001 Bradycardia, unspecified: Secondary | ICD-10-CM | POA: Diagnosis not present

## 2016-09-14 DIAGNOSIS — I951 Orthostatic hypotension: Secondary | ICD-10-CM | POA: Diagnosis not present

## 2016-09-28 DIAGNOSIS — I679 Cerebrovascular disease, unspecified: Secondary | ICD-10-CM | POA: Diagnosis not present

## 2016-09-28 DIAGNOSIS — D649 Anemia, unspecified: Secondary | ICD-10-CM | POA: Diagnosis not present

## 2016-09-28 DIAGNOSIS — N184 Chronic kidney disease, stage 4 (severe): Secondary | ICD-10-CM | POA: Diagnosis not present

## 2016-09-28 DIAGNOSIS — R001 Bradycardia, unspecified: Secondary | ICD-10-CM | POA: Diagnosis not present

## 2016-09-28 DIAGNOSIS — E785 Hyperlipidemia, unspecified: Secondary | ICD-10-CM | POA: Diagnosis not present

## 2016-09-28 DIAGNOSIS — D696 Thrombocytopenia, unspecified: Secondary | ICD-10-CM | POA: Diagnosis not present

## 2016-09-28 DIAGNOSIS — Z23 Encounter for immunization: Secondary | ICD-10-CM | POA: Diagnosis not present

## 2016-09-28 DIAGNOSIS — E088 Diabetes mellitus due to underlying condition with unspecified complications: Secondary | ICD-10-CM | POA: Diagnosis not present

## 2016-09-28 DIAGNOSIS — W19XXXA Unspecified fall, initial encounter: Secondary | ICD-10-CM | POA: Diagnosis not present

## 2016-09-28 DIAGNOSIS — I951 Orthostatic hypotension: Secondary | ICD-10-CM | POA: Diagnosis not present

## 2016-09-28 DIAGNOSIS — G3184 Mild cognitive impairment, so stated: Secondary | ICD-10-CM | POA: Diagnosis not present

## 2016-09-28 DIAGNOSIS — N179 Acute kidney failure, unspecified: Secondary | ICD-10-CM | POA: Diagnosis not present

## 2016-09-28 DIAGNOSIS — R531 Weakness: Secondary | ICD-10-CM | POA: Diagnosis not present

## 2016-09-28 DIAGNOSIS — M6281 Muscle weakness (generalized): Secondary | ICD-10-CM | POA: Diagnosis not present

## 2016-09-28 DIAGNOSIS — I10 Essential (primary) hypertension: Secondary | ICD-10-CM | POA: Diagnosis not present

## 2016-09-28 DIAGNOSIS — N400 Enlarged prostate without lower urinary tract symptoms: Secondary | ICD-10-CM | POA: Diagnosis not present

## 2016-09-30 DIAGNOSIS — I10 Essential (primary) hypertension: Secondary | ICD-10-CM | POA: Diagnosis not present

## 2016-09-30 DIAGNOSIS — N400 Enlarged prostate without lower urinary tract symptoms: Secondary | ICD-10-CM | POA: Diagnosis not present

## 2016-09-30 DIAGNOSIS — E785 Hyperlipidemia, unspecified: Secondary | ICD-10-CM | POA: Diagnosis not present

## 2016-09-30 DIAGNOSIS — I679 Cerebrovascular disease, unspecified: Secondary | ICD-10-CM | POA: Diagnosis not present

## 2016-10-01 DIAGNOSIS — Z23 Encounter for immunization: Secondary | ICD-10-CM | POA: Diagnosis not present

## 2016-10-01 DIAGNOSIS — D649 Anemia, unspecified: Secondary | ICD-10-CM | POA: Diagnosis not present

## 2016-10-01 DIAGNOSIS — I10 Essential (primary) hypertension: Secondary | ICD-10-CM | POA: Diagnosis not present

## 2016-10-05 DIAGNOSIS — W19XXXA Unspecified fall, initial encounter: Secondary | ICD-10-CM | POA: Diagnosis not present

## 2016-10-05 DIAGNOSIS — I10 Essential (primary) hypertension: Secondary | ICD-10-CM | POA: Diagnosis not present

## 2016-10-18 DIAGNOSIS — R2689 Other abnormalities of gait and mobility: Secondary | ICD-10-CM | POA: Diagnosis not present

## 2016-10-18 DIAGNOSIS — M6281 Muscle weakness (generalized): Secondary | ICD-10-CM | POA: Diagnosis not present

## 2016-10-18 DIAGNOSIS — R278 Other lack of coordination: Secondary | ICD-10-CM | POA: Diagnosis not present

## 2016-10-19 DIAGNOSIS — R278 Other lack of coordination: Secondary | ICD-10-CM | POA: Diagnosis not present

## 2016-10-19 DIAGNOSIS — R2689 Other abnormalities of gait and mobility: Secondary | ICD-10-CM | POA: Diagnosis not present

## 2016-10-19 DIAGNOSIS — D649 Anemia, unspecified: Secondary | ICD-10-CM | POA: Diagnosis not present

## 2016-10-19 DIAGNOSIS — M6281 Muscle weakness (generalized): Secondary | ICD-10-CM | POA: Diagnosis not present

## 2016-10-20 DIAGNOSIS — R2689 Other abnormalities of gait and mobility: Secondary | ICD-10-CM | POA: Diagnosis not present

## 2016-10-20 DIAGNOSIS — M6281 Muscle weakness (generalized): Secondary | ICD-10-CM | POA: Diagnosis not present

## 2016-10-20 DIAGNOSIS — R278 Other lack of coordination: Secondary | ICD-10-CM | POA: Diagnosis not present

## 2016-10-21 DIAGNOSIS — R2689 Other abnormalities of gait and mobility: Secondary | ICD-10-CM | POA: Diagnosis not present

## 2016-10-21 DIAGNOSIS — R278 Other lack of coordination: Secondary | ICD-10-CM | POA: Diagnosis not present

## 2016-10-21 DIAGNOSIS — M6281 Muscle weakness (generalized): Secondary | ICD-10-CM | POA: Diagnosis not present

## 2016-10-22 DIAGNOSIS — R278 Other lack of coordination: Secondary | ICD-10-CM | POA: Diagnosis not present

## 2016-10-22 DIAGNOSIS — M6281 Muscle weakness (generalized): Secondary | ICD-10-CM | POA: Diagnosis not present

## 2016-10-22 DIAGNOSIS — R2689 Other abnormalities of gait and mobility: Secondary | ICD-10-CM | POA: Diagnosis not present

## 2016-10-24 DIAGNOSIS — M6281 Muscle weakness (generalized): Secondary | ICD-10-CM | POA: Diagnosis not present

## 2016-10-24 DIAGNOSIS — R2689 Other abnormalities of gait and mobility: Secondary | ICD-10-CM | POA: Diagnosis not present

## 2016-10-24 DIAGNOSIS — R278 Other lack of coordination: Secondary | ICD-10-CM | POA: Diagnosis not present

## 2016-10-25 DIAGNOSIS — M6281 Muscle weakness (generalized): Secondary | ICD-10-CM | POA: Diagnosis not present

## 2016-10-25 DIAGNOSIS — I129 Hypertensive chronic kidney disease with stage 1 through stage 4 chronic kidney disease, or unspecified chronic kidney disease: Secondary | ICD-10-CM | POA: Diagnosis not present

## 2016-10-25 DIAGNOSIS — R2689 Other abnormalities of gait and mobility: Secondary | ICD-10-CM | POA: Diagnosis not present

## 2016-10-25 DIAGNOSIS — N2581 Secondary hyperparathyroidism of renal origin: Secondary | ICD-10-CM | POA: Diagnosis not present

## 2016-10-25 DIAGNOSIS — D631 Anemia in chronic kidney disease: Secondary | ICD-10-CM | POA: Diagnosis not present

## 2016-10-25 DIAGNOSIS — R278 Other lack of coordination: Secondary | ICD-10-CM | POA: Diagnosis not present

## 2016-10-25 DIAGNOSIS — N184 Chronic kidney disease, stage 4 (severe): Secondary | ICD-10-CM | POA: Diagnosis not present

## 2016-10-25 DIAGNOSIS — E1129 Type 2 diabetes mellitus with other diabetic kidney complication: Secondary | ICD-10-CM | POA: Diagnosis not present

## 2016-10-26 DIAGNOSIS — R2689 Other abnormalities of gait and mobility: Secondary | ICD-10-CM | POA: Diagnosis not present

## 2016-10-26 DIAGNOSIS — R278 Other lack of coordination: Secondary | ICD-10-CM | POA: Diagnosis not present

## 2016-10-26 DIAGNOSIS — M6281 Muscle weakness (generalized): Secondary | ICD-10-CM | POA: Diagnosis not present

## 2016-10-27 DIAGNOSIS — R278 Other lack of coordination: Secondary | ICD-10-CM | POA: Diagnosis not present

## 2016-10-27 DIAGNOSIS — M6281 Muscle weakness (generalized): Secondary | ICD-10-CM | POA: Diagnosis not present

## 2016-10-27 DIAGNOSIS — R2689 Other abnormalities of gait and mobility: Secondary | ICD-10-CM | POA: Diagnosis not present

## 2016-10-27 DIAGNOSIS — I951 Orthostatic hypotension: Secondary | ICD-10-CM | POA: Diagnosis not present

## 2016-10-28 DIAGNOSIS — R2689 Other abnormalities of gait and mobility: Secondary | ICD-10-CM | POA: Diagnosis not present

## 2016-10-28 DIAGNOSIS — R278 Other lack of coordination: Secondary | ICD-10-CM | POA: Diagnosis not present

## 2016-10-28 DIAGNOSIS — I10 Essential (primary) hypertension: Secondary | ICD-10-CM | POA: Diagnosis not present

## 2016-10-28 DIAGNOSIS — M6281 Muscle weakness (generalized): Secondary | ICD-10-CM | POA: Diagnosis not present

## 2016-10-28 DIAGNOSIS — E785 Hyperlipidemia, unspecified: Secondary | ICD-10-CM | POA: Diagnosis not present

## 2016-10-29 DIAGNOSIS — R2689 Other abnormalities of gait and mobility: Secondary | ICD-10-CM | POA: Diagnosis not present

## 2016-10-29 DIAGNOSIS — M6281 Muscle weakness (generalized): Secondary | ICD-10-CM | POA: Diagnosis not present

## 2016-10-29 DIAGNOSIS — R278 Other lack of coordination: Secondary | ICD-10-CM | POA: Diagnosis not present

## 2016-11-01 DIAGNOSIS — N179 Acute kidney failure, unspecified: Secondary | ICD-10-CM | POA: Diagnosis not present

## 2016-11-02 DIAGNOSIS — M6281 Muscle weakness (generalized): Secondary | ICD-10-CM | POA: Diagnosis not present

## 2016-11-02 DIAGNOSIS — R2689 Other abnormalities of gait and mobility: Secondary | ICD-10-CM | POA: Diagnosis not present

## 2016-11-02 DIAGNOSIS — R278 Other lack of coordination: Secondary | ICD-10-CM | POA: Diagnosis not present

## 2016-11-02 DIAGNOSIS — N189 Chronic kidney disease, unspecified: Secondary | ICD-10-CM | POA: Diagnosis not present

## 2016-11-03 DIAGNOSIS — R2689 Other abnormalities of gait and mobility: Secondary | ICD-10-CM | POA: Diagnosis not present

## 2016-11-03 DIAGNOSIS — M6281 Muscle weakness (generalized): Secondary | ICD-10-CM | POA: Diagnosis not present

## 2016-11-03 DIAGNOSIS — R278 Other lack of coordination: Secondary | ICD-10-CM | POA: Diagnosis not present

## 2016-11-16 DIAGNOSIS — D696 Thrombocytopenia, unspecified: Secondary | ICD-10-CM | POA: Diagnosis not present

## 2016-11-17 DIAGNOSIS — N189 Chronic kidney disease, unspecified: Secondary | ICD-10-CM | POA: Diagnosis not present

## 2016-11-17 DIAGNOSIS — J3489 Other specified disorders of nose and nasal sinuses: Secondary | ICD-10-CM | POA: Diagnosis not present

## 2016-12-03 DIAGNOSIS — K2210 Ulcer of esophagus without bleeding: Secondary | ICD-10-CM | POA: Diagnosis not present

## 2016-12-03 DIAGNOSIS — D649 Anemia, unspecified: Secondary | ICD-10-CM | POA: Diagnosis not present

## 2016-12-03 DIAGNOSIS — E1122 Type 2 diabetes mellitus with diabetic chronic kidney disease: Secondary | ICD-10-CM | POA: Diagnosis not present

## 2016-12-03 DIAGNOSIS — K209 Esophagitis, unspecified: Secondary | ICD-10-CM | POA: Diagnosis not present

## 2016-12-03 DIAGNOSIS — K2980 Duodenitis without bleeding: Secondary | ICD-10-CM | POA: Diagnosis not present

## 2016-12-03 DIAGNOSIS — R911 Solitary pulmonary nodule: Secondary | ICD-10-CM | POA: Diagnosis not present

## 2016-12-03 DIAGNOSIS — K228 Other specified diseases of esophagus: Secondary | ICD-10-CM | POA: Diagnosis not present

## 2016-12-03 DIAGNOSIS — R590 Localized enlarged lymph nodes: Secondary | ICD-10-CM | POA: Diagnosis not present

## 2016-12-03 DIAGNOSIS — E1165 Type 2 diabetes mellitus with hyperglycemia: Secondary | ICD-10-CM | POA: Diagnosis not present

## 2016-12-03 DIAGNOSIS — E785 Hyperlipidemia, unspecified: Secondary | ICD-10-CM | POA: Diagnosis not present

## 2016-12-03 DIAGNOSIS — E860 Dehydration: Secondary | ICD-10-CM | POA: Diagnosis not present

## 2016-12-03 DIAGNOSIS — R531 Weakness: Secondary | ICD-10-CM | POA: Diagnosis not present

## 2016-12-03 DIAGNOSIS — K5730 Diverticulosis of large intestine without perforation or abscess without bleeding: Secondary | ICD-10-CM | POA: Diagnosis not present

## 2016-12-03 DIAGNOSIS — D696 Thrombocytopenia, unspecified: Secondary | ICD-10-CM | POA: Diagnosis not present

## 2016-12-03 DIAGNOSIS — R1111 Vomiting without nausea: Secondary | ICD-10-CM | POA: Diagnosis not present

## 2016-12-03 DIAGNOSIS — K921 Melena: Secondary | ICD-10-CM | POA: Diagnosis not present

## 2016-12-03 DIAGNOSIS — K2211 Ulcer of esophagus with bleeding: Secondary | ICD-10-CM | POA: Diagnosis not present

## 2016-12-03 DIAGNOSIS — K922 Gastrointestinal hemorrhage, unspecified: Secondary | ICD-10-CM | POA: Diagnosis not present

## 2016-12-03 DIAGNOSIS — R1110 Vomiting, unspecified: Secondary | ICD-10-CM | POA: Diagnosis not present

## 2016-12-03 DIAGNOSIS — N184 Chronic kidney disease, stage 4 (severe): Secondary | ICD-10-CM | POA: Diagnosis not present

## 2016-12-03 DIAGNOSIS — R112 Nausea with vomiting, unspecified: Secondary | ICD-10-CM | POA: Diagnosis not present

## 2016-12-03 DIAGNOSIS — K529 Noninfective gastroenteritis and colitis, unspecified: Secondary | ICD-10-CM | POA: Diagnosis not present

## 2016-12-03 DIAGNOSIS — K591 Functional diarrhea: Secondary | ICD-10-CM | POA: Diagnosis not present

## 2016-12-03 DIAGNOSIS — I129 Hypertensive chronic kidney disease with stage 1 through stage 4 chronic kidney disease, or unspecified chronic kidney disease: Secondary | ICD-10-CM | POA: Diagnosis not present

## 2016-12-03 DIAGNOSIS — K920 Hematemesis: Secondary | ICD-10-CM | POA: Diagnosis not present

## 2016-12-03 DIAGNOSIS — K4090 Unilateral inguinal hernia, without obstruction or gangrene, not specified as recurrent: Secondary | ICD-10-CM | POA: Diagnosis not present

## 2016-12-03 DIAGNOSIS — K319 Disease of stomach and duodenum, unspecified: Secondary | ICD-10-CM | POA: Diagnosis not present

## 2016-12-06 DIAGNOSIS — I10 Essential (primary) hypertension: Secondary | ICD-10-CM | POA: Diagnosis not present

## 2016-12-06 DIAGNOSIS — R197 Diarrhea, unspecified: Secondary | ICD-10-CM | POA: Diagnosis not present

## 2016-12-06 DIAGNOSIS — I679 Cerebrovascular disease, unspecified: Secondary | ICD-10-CM | POA: Diagnosis not present

## 2016-12-06 DIAGNOSIS — E785 Hyperlipidemia, unspecified: Secondary | ICD-10-CM | POA: Diagnosis not present

## 2016-12-07 DIAGNOSIS — R590 Localized enlarged lymph nodes: Secondary | ICD-10-CM | POA: Diagnosis not present

## 2016-12-07 DIAGNOSIS — K922 Gastrointestinal hemorrhage, unspecified: Secondary | ICD-10-CM | POA: Diagnosis not present

## 2016-12-07 DIAGNOSIS — R112 Nausea with vomiting, unspecified: Secondary | ICD-10-CM | POA: Diagnosis not present

## 2016-12-07 DIAGNOSIS — D649 Anemia, unspecified: Secondary | ICD-10-CM | POA: Diagnosis not present

## 2016-12-14 DIAGNOSIS — N179 Acute kidney failure, unspecified: Secondary | ICD-10-CM | POA: Diagnosis not present

## 2016-12-14 DIAGNOSIS — D696 Thrombocytopenia, unspecified: Secondary | ICD-10-CM | POA: Diagnosis not present

## 2016-12-15 DIAGNOSIS — R197 Diarrhea, unspecified: Secondary | ICD-10-CM | POA: Diagnosis not present

## 2017-06-15 DEATH — deceased
